# Patient Record
Sex: Female | Born: 1975 | Race: White | Hispanic: No | Marital: Single | Smoking: Current every day smoker
Health system: Southern US, Community
[De-identification: ages and names within clinical notes are randomized; demographics above are authoritative.]

## PROBLEM LIST (undated history)

## (undated) DIAGNOSIS — F419 Anxiety disorder, unspecified: Secondary | ICD-10-CM

## (undated) DIAGNOSIS — K219 Gastro-esophageal reflux disease without esophagitis: Secondary | ICD-10-CM

## (undated) DIAGNOSIS — B1920 Unspecified viral hepatitis C without hepatic coma: Secondary | ICD-10-CM

## (undated) DIAGNOSIS — F32A Depression, unspecified: Secondary | ICD-10-CM

## (undated) DIAGNOSIS — I10 Essential (primary) hypertension: Secondary | ICD-10-CM

## (undated) DIAGNOSIS — F1910 Other psychoactive substance abuse, uncomplicated: Secondary | ICD-10-CM

## (undated) HISTORY — DX: Depression, unspecified: F32.A

## (undated) HISTORY — PX: DILATION AND CURETTAGE OF UTERUS: SHX78

## (undated) HISTORY — DX: Other psychoactive substance abuse, uncomplicated: F19.10

## (undated) HISTORY — DX: Essential (primary) hypertension: I10

## (undated) HISTORY — DX: Anxiety disorder, unspecified: F41.9

---

## 2001-03-11 ENCOUNTER — Other Ambulatory Visit: Admission: RE | Admit: 2001-03-11 | Discharge: 2001-03-11 | Payer: Self-pay | Admitting: *Deleted

## 2005-08-15 ENCOUNTER — Inpatient Hospital Stay (HOSPITAL_COMMUNITY): Admission: AD | Admit: 2005-08-15 | Discharge: 2005-08-17 | Payer: Self-pay | Admitting: Family Medicine

## 2006-02-24 ENCOUNTER — Ambulatory Visit (HOSPITAL_COMMUNITY): Admission: RE | Admit: 2006-02-24 | Discharge: 2006-02-24 | Payer: Self-pay | Admitting: Family Medicine

## 2007-04-28 ENCOUNTER — Emergency Department (HOSPITAL_COMMUNITY): Admission: EM | Admit: 2007-04-28 | Discharge: 2007-04-29 | Payer: Self-pay | Admitting: Emergency Medicine

## 2007-12-03 ENCOUNTER — Emergency Department (HOSPITAL_COMMUNITY): Admission: EM | Admit: 2007-12-03 | Discharge: 2007-12-04 | Payer: Self-pay | Admitting: Emergency Medicine

## 2007-12-10 ENCOUNTER — Emergency Department (HOSPITAL_COMMUNITY): Admission: EM | Admit: 2007-12-10 | Discharge: 2007-12-10 | Payer: Self-pay | Admitting: Emergency Medicine

## 2007-12-17 ENCOUNTER — Emergency Department (HOSPITAL_COMMUNITY): Admission: EM | Admit: 2007-12-17 | Discharge: 2007-12-17 | Payer: Self-pay | Admitting: Emergency Medicine

## 2007-12-18 ENCOUNTER — Emergency Department (HOSPITAL_COMMUNITY): Admission: EM | Admit: 2007-12-18 | Discharge: 2007-12-18 | Payer: Self-pay | Admitting: Emergency Medicine

## 2008-01-22 ENCOUNTER — Emergency Department (HOSPITAL_COMMUNITY): Admission: EM | Admit: 2008-01-22 | Discharge: 2008-01-22 | Payer: Self-pay | Admitting: Emergency Medicine

## 2008-04-07 ENCOUNTER — Emergency Department (HOSPITAL_COMMUNITY): Admission: EM | Admit: 2008-04-07 | Discharge: 2008-04-08 | Payer: Self-pay | Admitting: Emergency Medicine

## 2008-04-08 ENCOUNTER — Inpatient Hospital Stay (HOSPITAL_COMMUNITY): Admission: EM | Admit: 2008-04-08 | Discharge: 2008-04-11 | Payer: Self-pay | Admitting: Emergency Medicine

## 2008-06-14 ENCOUNTER — Encounter (INDEPENDENT_AMBULATORY_CARE_PROVIDER_SITE_OTHER): Payer: Self-pay | Admitting: Unknown Physician Specialty

## 2008-06-14 ENCOUNTER — Other Ambulatory Visit: Admission: RE | Admit: 2008-06-14 | Discharge: 2008-06-14 | Payer: Self-pay | Admitting: Unknown Physician Specialty

## 2008-11-04 ENCOUNTER — Emergency Department (HOSPITAL_COMMUNITY): Admission: EM | Admit: 2008-11-04 | Discharge: 2008-11-05 | Payer: Self-pay | Admitting: Emergency Medicine

## 2009-11-21 ENCOUNTER — Emergency Department (HOSPITAL_COMMUNITY): Admission: EM | Admit: 2009-11-21 | Discharge: 2009-11-21 | Payer: Self-pay | Admitting: Emergency Medicine

## 2009-11-28 ENCOUNTER — Other Ambulatory Visit: Admission: RE | Admit: 2009-11-28 | Discharge: 2009-11-28 | Payer: Self-pay | Admitting: Unknown Physician Specialty

## 2010-01-09 ENCOUNTER — Emergency Department (HOSPITAL_COMMUNITY): Admission: EM | Admit: 2010-01-09 | Discharge: 2010-01-09 | Payer: Self-pay | Admitting: Emergency Medicine

## 2010-05-31 ENCOUNTER — Emergency Department (HOSPITAL_COMMUNITY): Admission: EM | Admit: 2010-05-31 | Discharge: 2010-05-31 | Payer: Self-pay | Admitting: Emergency Medicine

## 2010-06-05 ENCOUNTER — Emergency Department (HOSPITAL_COMMUNITY): Admission: EM | Admit: 2010-06-05 | Discharge: 2010-06-05 | Payer: Self-pay | Admitting: Emergency Medicine

## 2010-07-23 ENCOUNTER — Emergency Department (HOSPITAL_COMMUNITY): Admission: EM | Admit: 2010-07-23 | Discharge: 2010-07-23 | Payer: Self-pay | Admitting: Emergency Medicine

## 2010-08-20 ENCOUNTER — Inpatient Hospital Stay (HOSPITAL_COMMUNITY): Admission: EM | Admit: 2010-08-20 | Discharge: 2010-08-24 | Payer: Self-pay | Source: Home / Self Care

## 2010-09-22 ENCOUNTER — Emergency Department (HOSPITAL_COMMUNITY)
Admission: EM | Admit: 2010-09-22 | Discharge: 2010-09-22 | Payer: Self-pay | Source: Home / Self Care | Admitting: Emergency Medicine

## 2010-10-01 LAB — BASIC METABOLIC PANEL
BUN: 10 mg/dL (ref 6–23)
CO2: 26 mEq/L (ref 19–32)
Calcium: 9.2 mg/dL (ref 8.4–10.5)
Chloride: 102 mEq/L (ref 96–112)
Creatinine, Ser: 0.94 mg/dL (ref 0.4–1.2)
GFR calc Af Amer: >60 mL/min (ref >60)
GFR calc non Af Amer: >60 mL/min (ref >60)
Glucose, Bld: 110 mg/dL — ABNORMAL HIGH (ref 70–99)
Potassium: 3.4 mEq/L — ABNORMAL LOW (ref 3.5–5.1)
Sodium: 136 mEq/L (ref 135–145)

## 2010-10-01 LAB — CBC
HCT: 43.2 % (ref 36.0–46.0)
Hemoglobin: 15.2 g/dL — ABNORMAL HIGH (ref 12.0–15.0)
MCH: 30 pg (ref 26.0–34.0)
MCHC: 35.2 g/dL (ref 30.0–36.0)
MCV: 85.4 fL (ref 78.0–100.0)
Platelets: 193 10*3/uL (ref 150–400)
RBC: 5.06 MIL/uL (ref 3.87–5.11)
RDW: 12.9 % (ref 11.5–15.5)
WBC: 9.2 10*3/uL (ref 4.0–10.5)

## 2010-10-01 LAB — RAPID URINE DRUG SCREEN, HOSP PERFORMED
Amphetamines: NOT DETECTED
Barbiturates: NOT DETECTED
Benzodiazepines: NOT DETECTED
Cocaine: NOT DETECTED
Opiates: POSITIVE — AB
Tetrahydrocannabinol: NOT DETECTED

## 2010-10-01 LAB — ETHANOL: Alcohol, Ethyl (B): <5 mg/dL (ref 0–10)

## 2010-10-01 LAB — DIFFERENTIAL
Basophils Absolute: 0.1 10*3/uL (ref 0.0–0.1)
Basophils Relative: 1 % (ref 0–1)
Eosinophils Absolute: 0.5 10*3/uL (ref 0.0–0.7)
Eosinophils Relative: 5 % (ref 0–5)
Lymphocytes Relative: 36 % (ref 12–46)
Lymphs Abs: 3.4 10*3/uL (ref 0.7–4.0)
Monocytes Absolute: 0.6 10*3/uL (ref 0.1–1.0)
Monocytes Relative: 6 % (ref 3–12)
Neutro Abs: 4.8 10*3/uL (ref 1.7–7.7)
Neutrophils Relative %: 52 % (ref 43–77)

## 2010-11-26 LAB — BASIC METABOLIC PANEL
BUN: 7 mg/dL (ref 6–23)
CO2: 17 mEq/L — ABNORMAL LOW (ref 19–32)
Calcium: 8.1 mg/dL — ABNORMAL LOW (ref 8.4–10.5)
GFR calc Af Amer: >60 mL/min (ref >60)
GFR calc Af Amer: >60 mL/min (ref >60)
GFR calc non Af Amer: >60 mL/min (ref >60)
GFR calc non Af Amer: >60 mL/min (ref >60)
Potassium: 4.1 mEq/L (ref 3.5–5.1)
Sodium: 141 mEq/L (ref 135–145)

## 2010-11-26 LAB — MAGNESIUM
Magnesium: 1.2 mg/dL — ABNORMAL LOW (ref 1.5–2.5)
Magnesium: 1.4 mg/dL — ABNORMAL LOW (ref 1.5–2.5)

## 2010-11-26 LAB — URINALYSIS, ROUTINE W REFLEX MICROSCOPIC
Bilirubin Urine: NEGATIVE
Glucose, UA: NEGATIVE mg/dL
Glucose, UA: NEGATIVE mg/dL
Ketones, ur: NEGATIVE mg/dL
Leukocytes, UA: NEGATIVE
Leukocytes, UA: NEGATIVE
Nitrite: NEGATIVE
Protein, ur: >300 mg/dL — AB
Protein, ur: NEGATIVE mg/dL
Specific Gravity, Urine: 1.025 (ref 1.005–1.030)
Urobilinogen, UA: 0.2 mg/dL (ref 0.0–1.0)
pH: 5.5 (ref 5.0–8.0)
pH: 6 (ref 5.0–8.0)

## 2010-11-26 LAB — CULTURE, BLOOD (ROUTINE X 2): Culture: NO GROWTH

## 2010-11-26 LAB — URINE MICROSCOPIC-ADD ON

## 2010-11-26 LAB — DIFFERENTIAL
Basophils Relative: 0 % (ref 0–1)
Eosinophils Absolute: 0.4 10*3/uL (ref 0.0–0.7)
Eosinophils Relative: 1 % (ref 0–5)
Eosinophils Relative: 1 % (ref 0–5)
Eosinophils Relative: 5 % (ref 0–5)
Lymphocytes Relative: 12 % (ref 12–46)
Lymphocytes Relative: 12 % (ref 12–46)
Lymphocytes Relative: 30 % (ref 12–46)
Lymphs Abs: 0.4 10*3/uL — ABNORMAL LOW (ref 0.7–4.0)
Lymphs Abs: 2.2 10*3/uL (ref 0.7–4.0)
Lymphs Abs: 2.7 10*3/uL (ref 0.7–4.0)
Lymphs Abs: 3.1 10*3/uL (ref 0.7–4.0)
Monocytes Absolute: 0 10*3/uL — ABNORMAL LOW (ref 0.1–1.0)
Monocytes Absolute: 1.3 10*3/uL — ABNORMAL HIGH (ref 0.1–1.0)
Monocytes Relative: 0 % — ABNORMAL LOW (ref 3–12)
Monocytes Relative: 4 % (ref 3–12)
Monocytes Relative: 5 % (ref 3–12)
Neutro Abs: 15.5 10*3/uL — ABNORMAL HIGH (ref 1.7–7.7)
Neutrophils Relative %: 83 % — ABNORMAL HIGH (ref 43–77)

## 2010-11-26 LAB — MRSA PCR SCREENING: MRSA by PCR: NEGATIVE

## 2010-11-26 LAB — CBC
HCT: 33.6 % — ABNORMAL LOW (ref 36.0–46.0)
Hemoglobin: 10.8 g/dL — ABNORMAL LOW (ref 12.0–15.0)
Hemoglobin: 11.2 g/dL — ABNORMAL LOW (ref 12.0–15.0)
Hemoglobin: 11.6 g/dL — ABNORMAL LOW (ref 12.0–15.0)
MCH: 30.1 pg (ref 26.0–34.0)
MCH: 30.8 pg (ref 26.0–34.0)
MCH: 30.8 pg (ref 26.0–34.0)
MCHC: 34.1 g/dL (ref 30.0–36.0)
MCHC: 34.9 g/dL (ref 30.0–36.0)
MCV: 87.3 fL (ref 78.0–100.0)
Platelets: 138 10*3/uL — ABNORMAL LOW (ref 150–400)
RBC: 3.64 MIL/uL — ABNORMAL LOW (ref 3.87–5.11)
RBC: 3.85 MIL/uL — ABNORMAL LOW (ref 3.87–5.11)
RDW: 13.3 % (ref 11.5–15.5)
WBC: 18.7 10*3/uL — ABNORMAL HIGH (ref 4.0–10.5)
WBC: 9.1 10*3/uL (ref 4.0–10.5)

## 2010-11-26 LAB — COMPREHENSIVE METABOLIC PANEL
ALT: 16 U/L (ref 0–35)
ALT: 21 U/L (ref 0–35)
AST: 23 U/L (ref 0–37)
AST: 27 U/L (ref 0–37)
Albumin: 2.7 g/dL — ABNORMAL LOW (ref 3.5–5.2)
Albumin: 3.4 g/dL — ABNORMAL LOW (ref 3.5–5.2)
Calcium: 7.9 mg/dL — ABNORMAL LOW (ref 8.4–10.5)
Calcium: 8.3 mg/dL — ABNORMAL LOW (ref 8.4–10.5)
Creatinine, Ser: 1.37 mg/dL — ABNORMAL HIGH (ref 0.4–1.2)
GFR calc Af Amer: 54 mL/min — ABNORMAL LOW (ref >60)
GFR calc Af Amer: >60 mL/min (ref >60)
Sodium: 140 mEq/L (ref 135–145)
Sodium: 144 mEq/L (ref 135–145)
Total Protein: 5.6 g/dL — ABNORMAL LOW (ref 6.0–8.3)
Total Protein: 6.1 g/dL (ref 6.0–8.3)

## 2010-11-26 LAB — URINE CULTURE
Colony Count: NO GROWTH
Culture  Setup Time: 201112071345
Culture: NO GROWTH

## 2010-11-26 LAB — STREP PNEUMONIAE URINARY ANTIGEN: Strep Pneumo Urinary Antigen: NEGATIVE

## 2010-11-26 LAB — CORTISOL-AM, BLOOD: Cortisol - AM: 32.1 ug/dL — ABNORMAL HIGH (ref 4.3–22.4)

## 2010-11-26 LAB — LACTATE DEHYDROGENASE: LDH: 173 U/L (ref 94–250)

## 2010-11-26 LAB — LEGIONELLA ANTIGEN, URINE

## 2010-11-26 LAB — LIPASE, BLOOD: Lipase: 35 U/L (ref 11–59)

## 2010-12-09 LAB — URINE MICROSCOPIC-ADD ON

## 2010-12-09 LAB — URINE CULTURE

## 2010-12-09 LAB — URINALYSIS, ROUTINE W REFLEX MICROSCOPIC
Bilirubin Urine: NEGATIVE
Glucose, UA: NEGATIVE mg/dL
Ketones, ur: NEGATIVE mg/dL
Nitrite: POSITIVE — AB
Specific Gravity, Urine: 1.025 (ref 1.005–1.030)
pH: 5.5 (ref 5.0–8.0)

## 2011-01-29 NOTE — H&P (Signed)
Sally Bennett, MIJANGOS               ACCOUNT NO.:  [id number]   MEDICAL RECORD NO.:  [id number]          PATIENT TYPE:  INP   LOCATION:  A303                          FACILITY:  [facility]   PHYSICIAN:  Scott A. Gerda Diss, MD    DATE OF BIRTH:  Jan 05, 1976   DATE OF ADMISSION:  04/08/2008  DATE OF DISCHARGE:  LH                              HISTORY & PHYSICAL   CHIEF COMPLAINT:  Right flank pain and discomfort.   HISTORY OF PRESENT ILLNESS:  This is a 35 year old white female who  started noticing some right flank pain and discomfort over the past  several days, became progressively worse despite trying some over-the-  counter medications, then she started noticing some mild fever, chills,  nausea, and vomiting.  She came to the emergency department, evaluated,  was told she had pyelonephritis, and was encouraged to be admitted but  at that point in time, she preferred to try outpatient therapy.  She  went home on Cipro and promptly was unable to keep that down and was  throwing up Cipro and comes back to the emergency department today  because of increasing pain and right flank discomfort along with nausea.  She denies dysuria or urinary frequency.  Denies diarrhea.  Denies  cough, congestion, or headache.   PAST MEDICAL HISTORY:  The patient has had some problems in the past  with depression and anxiety, also migraine and chronic back pain.   FAMILY HISTORY:  Noncontributory.  No HTN, diabetes, or heart disease.   ALLERGIES:  None.   Smokes a half to three-fourths of a pack a day.  Does not drink.   She is not on any medications currently.   REVIEW OF SYSTEMS:  See per above.   PHYSICAL EXAMINATION:  GENERAL:  Looks to feel ill.  VITAL SIGNS:  Low-grade fever.  HEENT:  TMs NL __________.  NECK:  No masses.  CHEST:  CTA, no crackles.  HEART:  Regular.  ABDOMEN:  Soft with right flank discomfort to percussion and mild right  upper quadrant discomfort on the axillary line.   Urinalysis showed a large number of wbc's and bacteria as well.  Urine  culture was gathered last night before she was sent home.  White count  18,000, left shift.  Current CBC and MET-7 are pending.   ASSESSMENT/PLAN:  Pyelonephritis, failed outpatient management.  Recommend IV antibiotics and IV fluids and pain and nausea control.  Medication for anxiety and sleep and Tylenol for any fever and monitor  closely and hopefully potentially home over the course of the next 48-72  hours depending on how quickly she responds.  Monitor closely.      Scott A. Gerda Diss, MD  Electronically Signed     SAL/MEDQ  D:  04/08/2008  T:  04/09/2008  Job:  [id number]

## 2011-01-29 NOTE — Group Therapy Note (Signed)
Sally Bennett, Sally Bennett               ACCOUNT NO.:  [id number]   MEDICAL RECORD NO.:  [id number]          PATIENT TYPE:  INP   LOCATION:  A303                          FACILITY:  [facility]   PHYSICIAN:  Scott A. Gerda Diss, MD    DATE OF BIRTH:  1976/04/05   DATE OF PROCEDURE:  04/09/2008  DATE OF DISCHARGE:                                 PROGRESS NOTE   Overall feeling a little bit better, but still has pretty severe pain in  the right flank area and states that it woke her up multiple times  during the night.  She just had a hard time resting.  It should be noted  that she did have her blood work done, which did in fact show CBC of  16,000 white count and left shift and urine microscope, which did in  fact show 2150 WBCs and also GC Chlamydia came back from April 07, 2008,  which was negative.  It also should be noted that the patient did not  have a wet prep, which was none seen.  Looking at her records, it has  been my understanding that a urine pregnancy was done, but I do not see  where or when it was done, so urine pregnancy test was ordered plus also  two CBC MET-7 in the morning on April 10, 2008, plus also increase the  Dilaudid range 1 mg to 2 mg q.3 h. p.r.n. pain and Ambien 10 mg nightly  p.r.n. and wheelchair use if off floor and change IV fluids to D5 0.45  with 20 of KCl per liter at 100 mL an hour, and I do not feel the  patient is stable and to go home.  At this point, I recommend that she  stay in and it should be noted that her lungs are clear.  Heart regular.  Abdomen is soft, right flank tenderness, and she did appear to be  uncomfortable.  Please see orders and continue on with the orders as  written.      Scott A. Gerda Diss, MD  Electronically Signed     SAL/MEDQ  D:  04/09/2008  T:  04/09/2008  Job:  [id number]

## 2011-02-01 NOTE — H&P (Signed)
NAMECHEYENNA, Sally Bennett               ACCOUNT NO.:  [id number]   MEDICAL RECORD NO.:  [id number]          PATIENT TYPE:  INP   LOCATION:  A307                          FACILITY:  [facility]   PHYSICIAN:  Donna Bernard, M.D.DATE OF BIRTH:  [birth year]   DATE OF ADMISSION:  08/15/2005  DATE OF DISCHARGE:  LH                                HISTORY & PHYSICAL   CHIEF COMPLAINT:  Abdominal pain, vomiting and diarrhea.   HISTORY OF PRESENT ILLNESS:  This patient is a 35 year old, white female who  presented to the office the day of admission with complaints of severe  distress.  She states she had mid epigastric abdominal pain very severe in  nature, sharp, deep, aching at times, minimal radiation.  This was  accompanied since last evening by profuse diarrhea along with vomiting.  The  patient also noted feeling very anxious and tremulous.  On further history,  the patient states that she has been taking 10 Vicodin per day on average  for quite some time.  Of note, we were prescribing her Vicodin on the order  of 20 maximum per month which would equal of course less than one tablet per  day.  The patient obtained all these other Vicodin from other sources.  The patient recently had been going through a lot of marital stress and  temporary separation with her husband.  This is now improved.  She had a  frank talk with her husband last night with the strong desire to want to get  off of medicine.  The patient is not suicidal and not homicidal.  She is  depressed.  She is anxious.  She has never had history before with  difficulties with medicine addiction in the past.   PAST MEDICAL HISTORY:  1.  Migraine headaches.  2.  Chronic back pain.  3.  Depression and anxiety.   FAMILY HISTORY:  Strong family history of depression.   SOCIAL HISTORY:  The patient is married.  Smokes 1/2 pack per day.  No  alcohol use.  The patient has one child.   ALLERGIES:  No known drug allergies.   REVIEW OF  SYSTEMS:  Otherwise negative.   PHYSICAL EXAMINATION:  VITAL SIGNS:  Blood pressure 105/64, weight 107,  afebrile.  GENERAL:  The patient is slightly tearful, shaking, holding her abdomen in  significant distress when first seen in the office.  HEENT:  TMs normal.  Fundi and discs sharp.  Pharynx normal.  NECK:  Supple.  LUNGS:  Clear.  No tachypnea.  HEART:  Regular rate and rhythm.  ABDOMEN:  Soft.  No spinal tenderness.  Diffuse abdominal tenderness in the  abdomen across the upper abdomen primarily in the lower abdomen.  NEUROLOGIC:  Reflexes good.  SKIN:  Normal.   LABORATORY DATA AND X-RAY FINDINGS:  Significant labs pending.   IMPRESSION:  Abdominal pain, vomiting, diarrhea, tremulousness all in the  environment from sudden withdrawal from high-intake narcotics.  This may  well be the etiology to all her symptoms.   PLAN:  1.  IV fluids.  2.  Blood work.  3.  Further orders as noted in the chart.  4.  Will initiate Catapres patch.  5.  Utilize Xanax for anxiety or insomnia.  6.  Will give her ever decreasing morphine doses IV p.r.n. 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  7.  Further orders as noted in the chart.      Donna Bernard, M.D.  Electronically Signed     WSL/MEDQ  D:  08/15/2005  T:  08/15/2005  Job:  [id number]

## 2011-02-01 NOTE — Discharge Summary (Signed)
NAMESHABREA, WELDIN               ACCOUNT NO.:  [id number]   MEDICAL RECORD NO.:  [id number]          PATIENT TYPE:  INP   LOCATION:  A303                          FACILITY:  [facility]   PHYSICIAN:  Scott A. Gerda Diss, MD    DATE OF BIRTH:  [birth year]   DATE OF ADMISSION:  04/08/2008  DATE OF DISCHARGE:  07/27/2009LH                               DISCHARGE SUMMARY   DISCHARGE DIAGNOSIS:  Pyelonephritis.   Admitted in after having right flank pain for several days, nausea,  vomiting, and chills.  Came to the emergency department, evaluated, and  felt to have pyelonephritis.  She has had problems in the past of  depression, anxiety, migraine, and back pain.  She was treated with IV  antibiotics.  It was noted that she was seen in the ED a couple of days  previous, treated with Cipro, unable to keep it down, and came back  because of increased discomfort.  She had 18,000 white count with a left  shift.  She was treated with antibiotics, and should also be noted that  we did a urine pregnancy test is negative as well.  She gradually  improved over the course of the next couple of days and was finally felt  stable for discharge on April 11, 2008, so she was discharged to home on  oral antibiotics and instructed to follow up with Dr. Simone Curia  next week.      Scott A. Gerda Diss, MD  Electronically Signed     SAL/MEDQ  D:  05/13/2008  T:  05/13/2008  Job:  [id number]

## 2011-02-01 NOTE — Discharge Summary (Signed)
Sally Bennett, Sally Bennett               ACCOUNT NO.:  [id number]   MEDICAL RECORD NO.:  [id number]          PATIENT TYPE:  INP   LOCATION:  A307                          FACILITY:  [facility]   PHYSICIAN:  Donna Bernard, M.D.DATE OF BIRTH:  September 15, 1976   DATE OF ADMISSION:  08/15/2005  DATE OF DISCHARGE:  12/02/2006LH                                 DISCHARGE SUMMARY   FINAL DIAGNOSES:  1.  Abdominal pain.  2.  Narcotic withdrawal.  3.  Depression/anxiety.   FINAL DISPOSITION:  Patient discharged to home.   DISCHARGE MEDICATIONS:  1.  Lexapro 10 mg daily.  2.  Xanax 0.5 mg p.r.n. sparingly for anxiety.  3.  Prilosec over-the-counter one daily.  4.  Ultram 50 mg one up to four times a day as needed for pain to be used      only short-term.   Follow up in the office next week with Dr. Lubertha South.   HISTORY AND PHYSICAL:  Please see H&P as dictated.   HOSPITAL COURSE:  This patient is a 35 year old white female who presented  to the office day of admission with complaints of significant abdominal  pain, aching at times.  This was accompanied by significant diarrhea and  vomiting.  The patient was also feeling very anxious.  She notes she had  been taking approximately 10 Vicodin per day on average for quite some time.  The vast majority she had obtained in an illicit manner.  She states she  took these to help face the stress of a separation from her husband.  Recently she and her husband got back together.  She had tried to abruptly  stop the medicine and developed all the symptoms that she developed.  The  patient was in severe discomfort.  She was strongly disinclined to go to any  other kind of inpatient setting for detoxification.  We went ahead and  admitted her to the hospital.  Patient was given IV fluids.  She was given  morphine initially for the pain.  Protonix 40 mg IV was administered every  24 hours.  A Catapres patch was applied for central blockade to help  diminish  symptoms from withdrawal.  Patient was found to be hypokalemic.  Her potassium therefore was supplemented with 20 mEq two daily.  Patient was  also given nicotine patch due to significant nicotine withdrawal symptoms.  Patient's initial white blood count was 16,600.  On repeat the following day  it was down to 11,000.  Her initial potassium was 3.1, on repeat 3.6.  Liver  enzymes were normal.  Patient improved rather quickly.  Day of  discharge her symptoms had much improved.  She was feeling ready to go home.  She was noting no thoughts of suicide or homicide or wanting to harm anybody  or herself.  Patient was discharged home with diagnosis and disposition as  noted above.      Donna Bernard, M.D.  Electronically Signed     WSL/MEDQ  D:  09/15/2005  T:  09/16/2005  Job:  [id number]

## 2011-02-25 IMAGING — CR DG CHEST 2V
2 series · 2 of 2 positions shown · non-contrast
Comparison: 08/21/2010

CLINICAL DATA: Septic shock, bilateral pneumonia

CHEST - 2 VIEW

[view not recorded (1 of 2)]
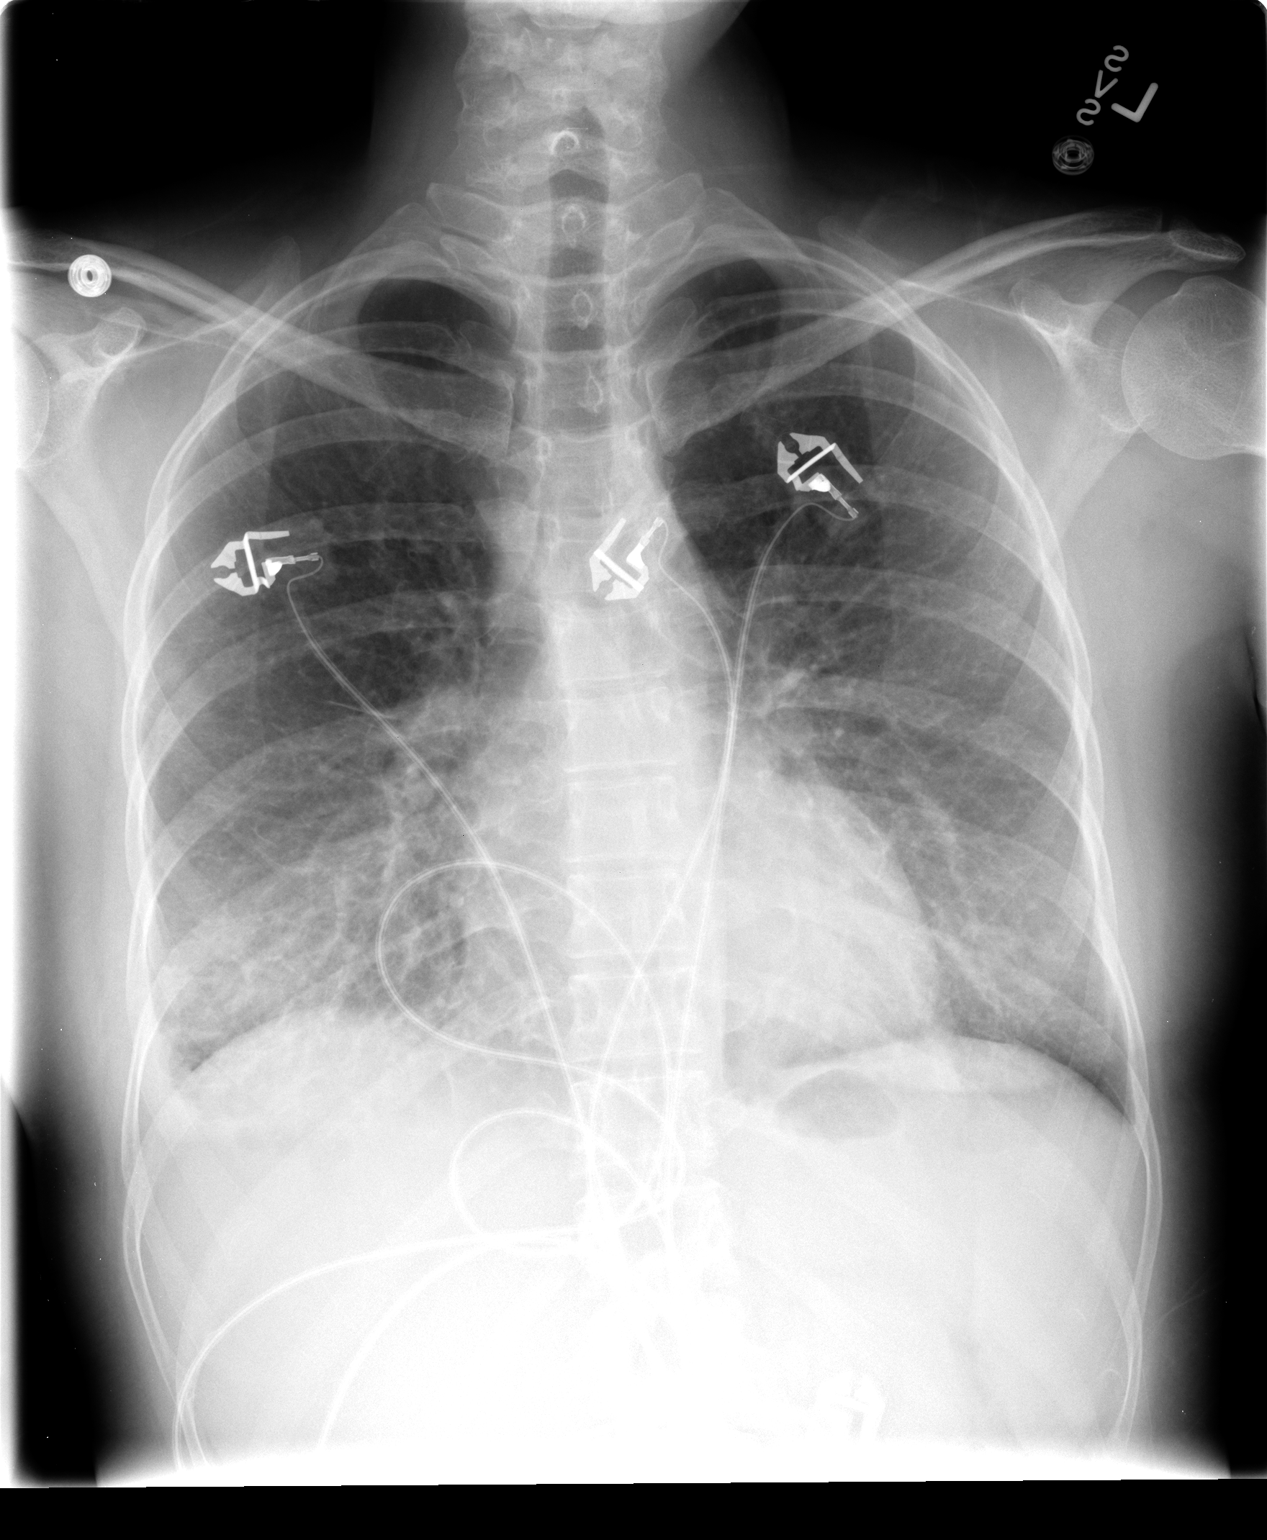

[view not recorded (2 of 2)]
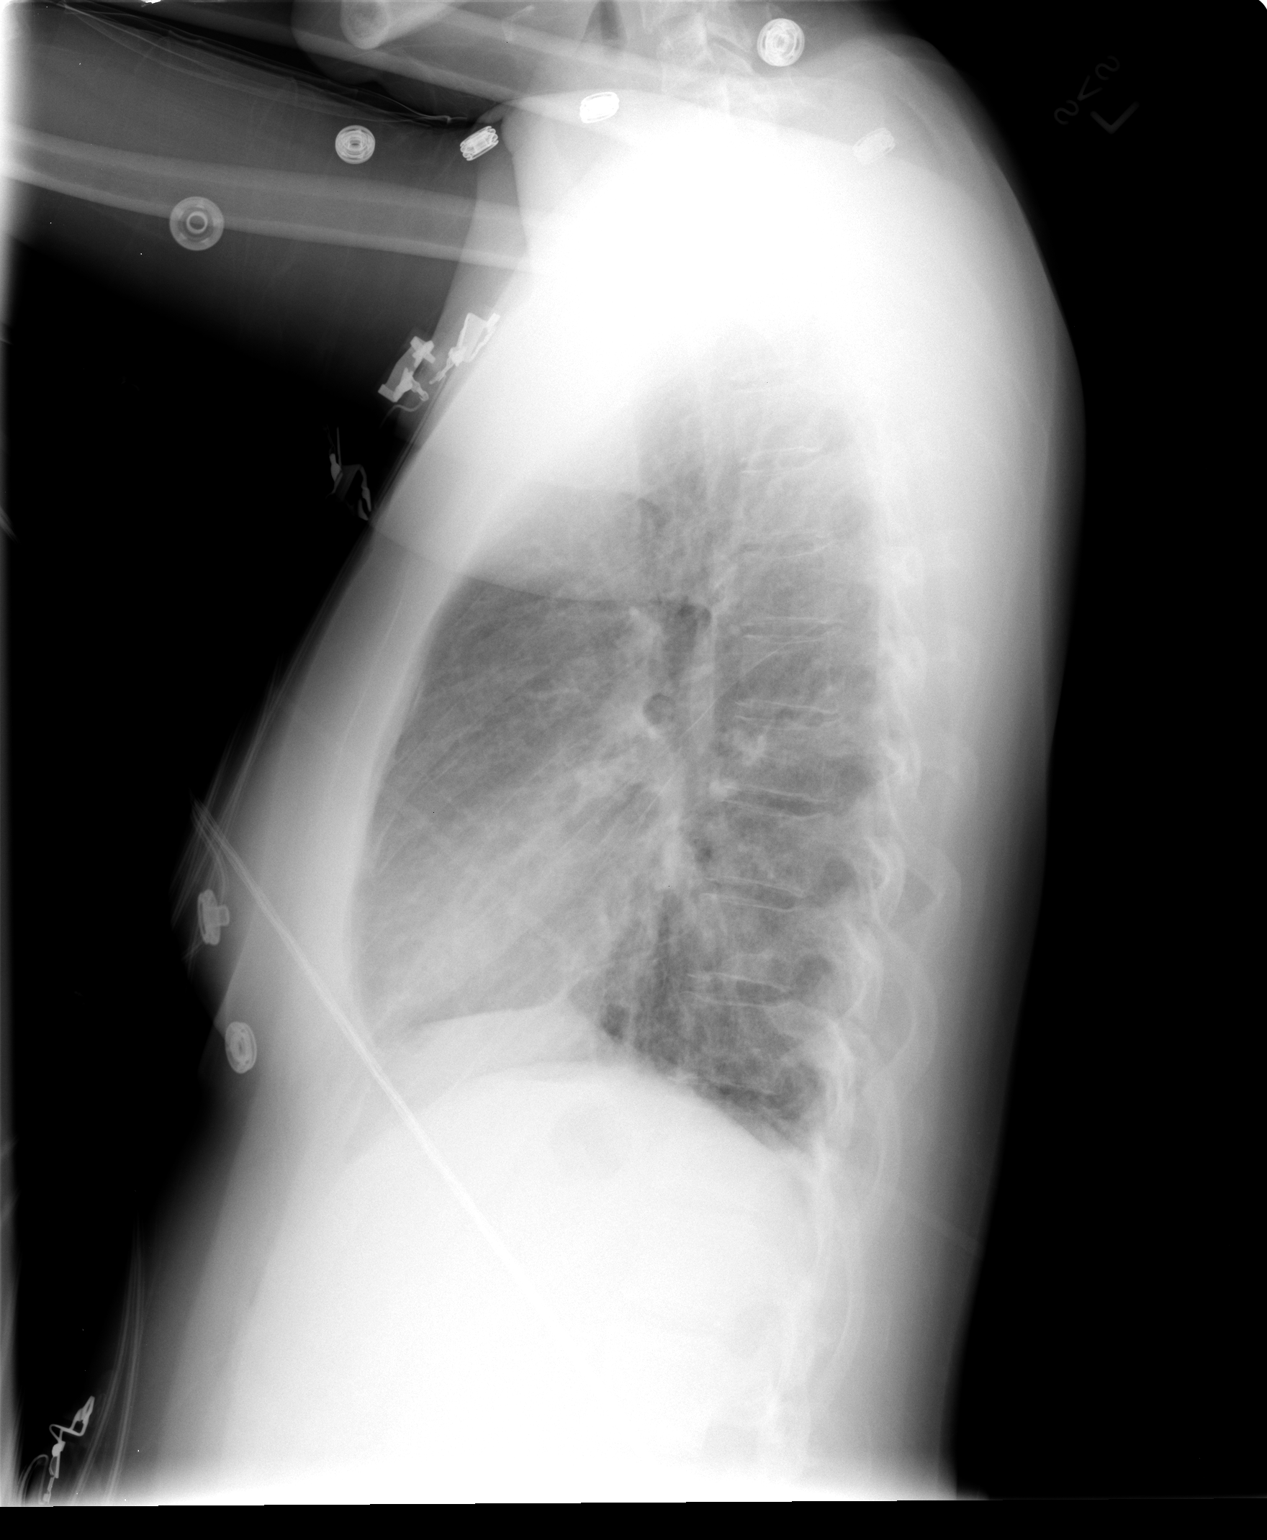

[2 of 2 positions shown; findings below may reference images not displayed]

FINDINGS: Upper normal-size of cardiac silhouette.
Mediastinal contours and pulmonary vascularity normal.
Left basilar infiltrate improved with persistent right basilar
infiltrate.
Small right pleural effusion.
Upper lungs clear.
Mild peribronchial thickening centrally.
No pneumothorax or acute osseous finding.
IMPRESSION: Persistent right basilar infiltrate with improved aeration at left
lung base since previous study.
New small right pleural effusion.

## 2011-06-11 LAB — PREGNANCY, URINE: Preg Test, Ur: NEGATIVE

## 2011-06-14 LAB — COMPREHENSIVE METABOLIC PANEL
ALT: 11
Alkaline Phosphatase: 108
BUN: 6
CO2: 24
Chloride: 106
GFR calc non Af Amer: >60
Glucose, Bld: 98
Potassium: 3.4 — ABNORMAL LOW
Sodium: 136
Total Bilirubin: 0.7
Total Protein: 6.9

## 2011-06-14 LAB — DIFFERENTIAL
Basophils Relative: 0
Basophils Relative: 0
Basophils Relative: 1
Eosinophils Absolute: 0.5
Eosinophils Absolute: 0.9 — ABNORMAL HIGH
Eosinophils Relative: 2
Eosinophils Relative: 3
Lymphocytes Relative: 9 — ABNORMAL LOW
Lymphs Abs: 1.7
Lymphs Abs: 2.1
Monocytes Absolute: 1.2 — ABNORMAL HIGH
Monocytes Relative: 6
Neutro Abs: 15.1 — ABNORMAL HIGH
Neutro Abs: 6.1
Neutrophils Relative %: 60

## 2011-06-14 LAB — PREGNANCY, URINE
Preg Test, Ur: NEGATIVE
Preg Test, Ur: NEGATIVE

## 2011-06-14 LAB — URINALYSIS, ROUTINE W REFLEX MICROSCOPIC
Bilirubin Urine: NEGATIVE
Ketones, ur: NEGATIVE
Nitrite: POSITIVE — AB
Protein, ur: 30 — AB
Protein, ur: 30 — AB
Specific Gravity, Urine: 1.02
Urobilinogen, UA: 0.2
Urobilinogen, UA: 4 — ABNORMAL HIGH
pH: 5.5

## 2011-06-14 LAB — CBC
HCT: 37.5
HCT: 39.1
Hemoglobin: 13.3
MCHC: 33.5
MCHC: 33.6
MCHC: 34
MCV: 96.2
MCV: 96.5
Platelets: 164
Platelets: 176
RBC: 4.09
RDW: 12.4
RDW: 12.7
WBC: 10.3
WBC: 16.2 — ABNORMAL HIGH

## 2011-06-14 LAB — GC/CHLAMYDIA PROBE AMP, GENITAL
Chlamydia, DNA Probe: NEGATIVE
GC Probe Amp, Genital: NEGATIVE

## 2011-06-14 LAB — WET PREP, GENITAL: Clue Cells Wet Prep HPF POC: NONE SEEN

## 2011-06-14 LAB — URINE CULTURE
Colony Count: NO GROWTH
Culture: NO GROWTH

## 2011-06-14 LAB — URINE MICROSCOPIC-ADD ON

## 2011-06-14 LAB — BASIC METABOLIC PANEL
BUN: 2 — ABNORMAL LOW
Calcium: 9
Chloride: 107
Creatinine, Ser: 0.69

## 2011-07-01 LAB — BASIC METABOLIC PANEL
Calcium: 8.8
GFR calc Af Amer: >60
GFR calc non Af Amer: >60
Sodium: 140

## 2011-07-01 LAB — DIFFERENTIAL
Basophils Absolute: 0.1
Lymphocytes Relative: 32
Lymphs Abs: 4.1 — ABNORMAL HIGH
Monocytes Absolute: 0.7
Monocytes Relative: 6
Neutro Abs: 7.3

## 2011-07-01 LAB — CBC
Hemoglobin: 14.8
RBC: 4.87

## 2015-03-07 ENCOUNTER — Emergency Department (HOSPITAL_COMMUNITY): Payer: BLUE CROSS/BLUE SHIELD

## 2015-03-07 ENCOUNTER — Emergency Department (HOSPITAL_COMMUNITY)
Admission: EM | Admit: 2015-03-07 | Discharge: 2015-03-07 | Disposition: A | Discharge status: Home / Self Care | Payer: BLUE CROSS/BLUE SHIELD | Attending: Emergency Medicine | Admitting: Emergency Medicine

## 2015-03-07 ENCOUNTER — Encounter (HOSPITAL_COMMUNITY): Payer: Self-pay | Admitting: *Deleted

## 2015-03-07 DIAGNOSIS — R6883 Chills (without fever): Secondary | ICD-10-CM | POA: Insufficient documentation

## 2015-03-07 DIAGNOSIS — R112 Nausea with vomiting, unspecified: Secondary | ICD-10-CM | POA: Insufficient documentation

## 2015-03-07 DIAGNOSIS — Z720 Tobacco use: Secondary | ICD-10-CM | POA: Insufficient documentation

## 2015-03-07 DIAGNOSIS — Z3202 Encounter for pregnancy test, result negative: Secondary | ICD-10-CM | POA: Diagnosis not present

## 2015-03-07 DIAGNOSIS — R52 Pain, unspecified: Secondary | ICD-10-CM

## 2015-03-07 DIAGNOSIS — Z8619 Personal history of other infectious and parasitic diseases: Secondary | ICD-10-CM | POA: Diagnosis not present

## 2015-03-07 DIAGNOSIS — R109 Unspecified abdominal pain: Secondary | ICD-10-CM

## 2015-03-07 DIAGNOSIS — R1013 Epigastric pain: Secondary | ICD-10-CM | POA: Diagnosis not present

## 2015-03-07 HISTORY — DX: Unspecified viral hepatitis C without hepatic coma: B19.20

## 2015-03-07 LAB — URINALYSIS, ROUTINE W REFLEX MICROSCOPIC
BILIRUBIN URINE: NEGATIVE
Glucose, UA: NEGATIVE mg/dL
Hgb urine dipstick: NEGATIVE
Ketones, ur: NEGATIVE mg/dL
LEUKOCYTES UA: NEGATIVE
NITRITE: NEGATIVE
PH: 5.5 (ref 5.0–8.0)
Protein, ur: NEGATIVE mg/dL
SPECIFIC GRAVITY, URINE: 1.01 (ref 1.005–1.030)
Urobilinogen, UA: 0.2 mg/dL (ref 0.0–1.0)

## 2015-03-07 LAB — COMPREHENSIVE METABOLIC PANEL
ALK PHOS: 115 U/L (ref 38–126)
ALT: 47 U/L (ref 14–54)
AST: 43 U/L — ABNORMAL HIGH (ref 15–41)
Albumin: 3.9 g/dL (ref 3.5–5.0)
Anion gap: 8 (ref 5–15)
BILIRUBIN TOTAL: 0.5 mg/dL (ref 0.3–1.2)
BUN: 9 mg/dL (ref 6–20)
CO2: 26 mmol/L (ref 22–32)
Calcium: 8.9 mg/dL (ref 8.9–10.3)
Chloride: 103 mmol/L (ref 101–111)
Creatinine, Ser: 0.73 mg/dL (ref 0.44–1.00)
GLUCOSE: 94 mg/dL (ref 65–99)
POTASSIUM: 3.2 mmol/L — AB (ref 3.5–5.1)
SODIUM: 137 mmol/L (ref 135–145)
TOTAL PROTEIN: 8.1 g/dL (ref 6.5–8.1)

## 2015-03-07 LAB — LIPASE, BLOOD: LIPASE: 23 U/L (ref 22–51)

## 2015-03-07 LAB — CBC WITH DIFFERENTIAL/PLATELET
BASOS PCT: 0 % (ref 0–1)
Basophils Absolute: 0 10*3/uL (ref 0.0–0.1)
EOS ABS: 0.7 10*3/uL (ref 0.0–0.7)
EOS PCT: 4 % (ref 0–5)
HEMATOCRIT: 46.7 % — AB (ref 36.0–46.0)
HEMOGLOBIN: 16 g/dL — AB (ref 12.0–15.0)
Lymphocytes Relative: 18 % (ref 12–46)
Lymphs Abs: 2.9 10*3/uL (ref 0.7–4.0)
MCH: 29.4 pg (ref 26.0–34.0)
MCHC: 34.3 g/dL (ref 30.0–36.0)
MCV: 85.8 fL (ref 78.0–100.0)
MONO ABS: 1 10*3/uL (ref 0.1–1.0)
MONOS PCT: 6 % (ref 3–12)
Neutro Abs: 11.3 10*3/uL — ABNORMAL HIGH (ref 1.7–7.7)
Neutrophils Relative %: 72 % (ref 43–77)
Platelets: 170 10*3/uL (ref 150–400)
RBC: 5.44 MIL/uL — ABNORMAL HIGH (ref 3.87–5.11)
RDW: 13.7 % (ref 11.5–15.5)
WBC: 15.9 10*3/uL — ABNORMAL HIGH (ref 4.0–10.5)

## 2015-03-07 LAB — POC URINE PREG, ED: Preg Test, Ur: NEGATIVE

## 2015-03-07 MED ORDER — ONDANSETRON 4 MG PO TBDP: ORAL_TABLET | ORAL | Status: DC

## 2015-03-07 MED ORDER — PANTOPRAZOLE SODIUM 40 MG IV SOLR
40.0000 mg | Freq: Once | INTRAVENOUS | Status: AC
  Administered 2015-03-07: 40 mg via INTRAVENOUS
  Filled 2015-03-07: qty 40

## 2015-03-07 MED ORDER — IOHEXOL 300 MG/ML  SOLN
100.0000 mL | Freq: Once | INTRAMUSCULAR | Status: AC | PRN
  Administered 2015-03-07: 100 mL via INTRAVENOUS

## 2015-03-07 MED ORDER — PANTOPRAZOLE SODIUM 20 MG PO TBEC: 20.0000 mg | DELAYED_RELEASE_TABLET | Freq: Every day | ORAL | Status: DC

## 2015-03-07 MED ORDER — KETOROLAC TROMETHAMINE 30 MG/ML IJ SOLN
30.0000 mg | Freq: Once | INTRAMUSCULAR | Status: AC
  Administered 2015-03-07: 30 mg via INTRAVENOUS
  Filled 2015-03-07: qty 1

## 2015-03-07 MED ORDER — IOHEXOL 300 MG/ML  SOLN
25.0000 mL | Freq: Once | INTRAMUSCULAR | Status: AC | PRN
  Administered 2015-03-07: 25 mL via ORAL

## 2015-03-07 MED ORDER — TRAMADOL HCL 50 MG PO TABS: 50.0000 mg | ORAL_TABLET | Freq: Four times a day (QID) | ORAL | Status: DC | PRN

## 2015-03-07 NOTE — ED Notes (Signed)
Pt requesting something for pain that is non-narcotic; Dr. Estell Harpin notified and pt given option of rectal tylenol, a narcotic or wait for he CT results to come back; pt chooses to wait for CT results; Dr. Estell Harpin informed

## 2015-03-07 NOTE — ED Provider Notes (Signed)
CSN: [id number]     Arrival date & time 03/07/15  2003 History   This chart was scribed for Bethann Berkshire, MD by Abel Presto, ED Scribe. This patient was seen in room APA02/APA02 and the patient's care was started at 8:24 PM.    Chief Complaint  Patient presents with  . Abdominal Pain     Patient is a 39 y.o. female presenting with abdominal pain. The history is provided by the patient. No language interpreter was used.  Abdominal Pain Pain location:  Epigastric Pain quality: sharp   Pain radiates to:  Does not radiate Pain severity:  Moderate Onset quality:  Sudden Duration:  1 day Timing:  Intermittent Progression:  Unchanged Chronicity:  New Relieved by:  None tried Worsened by:  Nothing tried Ineffective treatments:  None tried Associated symptoms: chills, nausea and vomiting   Associated symptoms: no chest pain, no cough, no diarrhea, no fatigue, no fever and no hematuria   Vomiting:    Quality:  Unable to specify   Number of occurrences:  1   Severity:  Mild   Duration:  1 day   Timing:  Rare   Progression:  Resolved  HPI Comments: Sally Bennett is a 39 y.o. female who presents to the Emergency Department complaining of intermittent sharp abdominal pain with onset. Pt notes associated chills and vomiting once.  She denies similar pain in past. She has not h/o abdominal surgery. Her LNMP was 3 weeks ago. Pt denies fever and diarrhea.   Past Medical History  Diagnosis Date  . Hepatitis C    History reviewed. No pertinent past surgical history. History reviewed. No pertinent family history. History  Substance Use Topics  . Smoking status: Current Every Day Smoker  . Smokeless tobacco: Not on file  . Alcohol Use: No   OB History    No data available     Review of Systems  Constitutional: Positive for chills. Negative for fever, appetite change and fatigue.  HENT: Negative for congestion, ear discharge and sinus pressure.   Eyes: Negative for discharge.   Respiratory: Negative for cough.   Cardiovascular: Negative for chest pain.  Gastrointestinal: Positive for nausea, vomiting and abdominal pain. Negative for diarrhea.  Genitourinary: Negative for frequency and hematuria.  Musculoskeletal: Negative for back pain.  Skin: Negative for rash.  Neurological: Negative for seizures and headaches.  Psychiatric/Behavioral: Negative for hallucinations.      Allergies  Review of patient's allergies indicates no known allergies.  Home Medications   Prior to Admission medications   Not on File   BP 139/86 mmHg  Pulse 101  Temp(Src) 99 F (37.2 C) (Oral)  Resp 20  Ht  (1.651 m)  Wt 142 lb (64.411 kg)  BMI 23.63 kg/m2  SpO2 100%  LMP 02/14/2015 Physical Exam  Constitutional: She is oriented to person, place, and time. She appears well-developed.  HENT:  Head: Normocephalic.  Eyes: Conjunctivae and EOM are normal. No scleral icterus.  Neck: Neck supple. No thyromegaly present.  Cardiovascular: Normal rate and regular rhythm.  Exam reveals no gallop and no friction rub.   No murmur heard. Pulmonary/Chest: No stridor. She has no wheezes. She has no rales. She exhibits no tenderness.  Abdominal: She exhibits no distension. There is tenderness (moderate) in the epigastric area. There is no rebound.  Musculoskeletal: Normal range of motion. She exhibits no edema.  Lymphadenopathy:    She has no cervical adenopathy.  Neurological: She is alert and oriented to person,  place, and time. She exhibits normal muscle tone. Coordination normal.  Skin: No rash noted. No erythema.  Psychiatric: She has a normal mood and affect. Her behavior is normal.  Nursing note and vitals reviewed.   ED Course  Procedures (including critical care time) DIAGNOSTIC STUDIES: Oxygen Saturation is 100% on room air, normal by my interpretation.    COORDINATION OF CARE: 8:26 PM Discussed treatment plan with patient at beside, the patient agrees with the  plan and has no further questions at this time.   Labs Review Labs Reviewed  URINALYSIS, ROUTINE W REFLEX MICROSCOPIC (NOT AT [facility])    Imaging Review No results found.   EKG Interpretation None      MDM   Final diagnoses:  None    abd pain with nl ct abd.  Pt to return for [place] in the am.  If [place] neg,  Pt to follow up with gi dr. Kelvin Cellar,  tx with ultram,  zofran and protonix  The chart was scribed for me under my direct supervision.  I personally performed the history, physical, and medical decision making and all procedures in the evaluation of this patient.Bethann Berkshire, MD 03/07/15 2325

## 2015-03-07 NOTE — ED Notes (Signed)
Pt c/o abdominal pain and nausea since early this morning.

## 2015-03-08 ENCOUNTER — Telehealth: Payer: Self-pay | Admitting: *Deleted

## 2015-03-08 ENCOUNTER — Ambulatory Visit (HOSPITAL_COMMUNITY)
Admit: 2015-03-08 | Discharge: 2015-03-08 | Disposition: A | Discharge status: Home / Self Care | Payer: BLUE CROSS/BLUE SHIELD | Source: Ambulatory Visit | Attending: Emergency Medicine | Admitting: Emergency Medicine

## 2015-03-08 ENCOUNTER — Other Ambulatory Visit (HOSPITAL_COMMUNITY): Payer: Self-pay | Admitting: Emergency Medicine

## 2015-03-08 DIAGNOSIS — R1013 Epigastric pain: Secondary | ICD-10-CM | POA: Diagnosis present

## 2015-03-08 NOTE — ED Provider Notes (Signed)
[place] reviewed. Cholelithiasis. May be causing her symptoms. Prescribed meds yesterday. Provided with Gen Surgery contacts information.   Raeford Razor, MD 03/11/15 [phone]

## 2015-03-08 NOTE — Telephone Encounter (Signed)
Pt called stating she was seen in the ER and they ER told her to follow up with Dr. Karilyn Cota, pt hasn't seen Dr. Karilyn Cota but has heard things about him and she does not want to see him, pt wants to see Dr. Darrick Penna. Pt states the ER did not find anything but gall stones, pt states the ER told her to follow up with Dr. Karilyn Cota to find out if she needs to have her gall bladder removed right now. Pt states she has [facility]. Pt called the ER back to have the referral sent to Dr. Darrick Penna because it has Dr. Cathie Beams name on it and pt stated Darel Hong the RN will be giving is a call. Can we see this pt?

## 2015-03-08 NOTE — Telephone Encounter (Signed)
Looks like the plan from the ED was to have her follow-up with GI IF the ultrasound was negative. It showed a gallstone.   Appears she was to see General Surgery. Would start there. We can see her as a new patient IF general surgery doesn't think gallbladder is the issue.

## 2015-03-08 NOTE — Telephone Encounter (Signed)
I made pt aware to follow up with Dr. Lovell Sheehan.

## 2015-04-05 NOTE — H&P (Signed)
  NTS SOAP Note  Vital Signs:  Vitals as of: 04/04/2015: Systolic 118: Diastolic 80: Heart Rate 96: Temp 98.53F: Height 455ft 4in: Weight 139Lbs 0 Ounces: BMI 23.86  BMI : 23.86 kg/m2  Subjective: This 39 year old female presents for of abdominal pain.  Has had two episodes of right upper quadrant abdominal pain with radiation to the right flank, nausea, and bloating over the past two months.  Made worse with fatty food.  No fever, chills, jaundice.  Was told she had hepatitis C while in jail, but has not had any further workup.  Will be seeing Dr. Darrick PennaFields, GI, for further workup.  Review of Symptoms:  Constitutional:unremarkable   Head:unremarkable Eyes:unremarkable   Nose/Mouth/Throat:unremarkable Cardiovascular:  unremarkable Respiratory:unremarkable Gastrointestinabdominal pain, nausea, heartburn Genitourinary:unremarkable   Musculoskeletal:unremarkable Skin:unremarkable Hematolgic/Lymphatic:unremarkable   Allergic/Immunologic:unremarkable   Past Medical History:  Reviewed  Past Medical History  Surgical History: none Medical Problems: hepatitis C Allergies: nkda Medications: none   Social History:Reviewed  Social History  Preferred Language: English Race:  White Ethnicity: Not Hispanic / Latino Age: 1935 year Marital Status:  S Alcohol: unknown   Smoking Status: Current every day smoker reviewed on 04/04/2015 Started Date:  Packs per day: 1.00 Functional Status reviewed on 04/04/2015 ------------------------------------------------ Bathing: Normal Cooking: Normal Dressing: Normal Driving: Normal Eating: Normal Managing Meds: Normal Oral Care: Normal Shopping: Normal Toileting: Normal Transferring: Normal Walking: Normal Cognitive Status reviewed on 04/04/2015 ------------------------------------------------ Attention: Normal Decision Making: Normal Language: Normal Memory: Normal Motor: Normal Perception: Normal Problem  Solving: Normal Visual and Spatial: Normal   Family History:Reviewed  Family Health History Family History is Unknown    Objective Information: General:Well appearing, well nourished in no distress. no scleral icterus Heart:RRR, no murmur Lungs:  CTA bilaterally, no wheezes, rhonchi, rales.  Breathing unlabored. Abdomen:Soft, NT/ND, no HSM, no masses.  Assessment:Cholecystitis, choleithiasis, hepatitis C  Diagnoses: 574.00  K80.00 Calculus of gallbladder with acute cholecystitis (Calculus of gallbladder with acute cholecystitis without obstruction)  Procedures: [id number] - OFFICE OUTPATIENT NEW 30 MINUTES    Plan:  Scheduled for laparoscopic cholecystectomy, liver biopsy on 04/28/15.   Patient Education:Alternative treatments to surgery were discussed with patient (and family).  Risks and benefits  of procedure including bleeding, infection, heptobiliary injury, and the possibility of an open proceudre were fully explained to the patient (and family) who gave informed consent. Patient/family questions were addressed.  Follow-up:Pending Surgery

## 2015-04-19 NOTE — Patient Instructions (Signed)
Sally Bennett  04/19/2015     @PREFPERIOPPHARMACY @   Your procedure is scheduled on 04/28/2015  Report to [facility] at  615  A.M.  Call this number if you have problems the morning of surgery:  [phone]   Remember:  Do not eat food or drink liquids after midnight.  Take these medicines the morning of surgery with A SIP OF WATER  Zofran, protonix, ultram.   Do not wear jewelry, make-up or nail polish.  Do not wear lotions, powders, or perfumes.    Do not shave 48 hours prior to surgery.  Men may shave face and neck.  Do not bring valuables to the hospital.  [facility] is not responsible for any belongings or valuables.  Contacts, dentures or bridgework may not be worn into surgery.  Leave your suitcase in the car.  After surgery it may be brought to your room.  For patients admitted to the hospital, discharge time will be determined by your treatment team.  Patients discharged the day of surgery will not be allowed to drive home.   Name and phone number of your driver:   family Special instructions:  none  Please read over the following fact sheets that you were given. Pain Booklet, Coughing and Deep Breathing, Surgical Site Infection Prevention, Anesthesia Post-op Instructions and Care and Recovery After Surgery      Laparoscopic Cholecystectomy Laparoscopic cholecystectomy is surgery to remove the gallbladder. The gallbladder is located in the upper right part of the abdomen, behind the liver. It is a storage sac for bile produced in the liver. Bile aids in the digestion and absorption of fats. Cholecystectomy is often done for inflammation of the gallbladder (cholecystitis). This condition is usually caused by a buildup of gallstones (cholelithiasis) in your gallbladder. Gallstones can block the flow of bile, resulting in inflammation and pain. In severe cases, emergency surgery may be required. When emergency surgery is not required, you will have time to  prepare for the procedure. Laparoscopic surgery is an alternative to open surgery. Laparoscopic surgery has a shorter recovery time. Your common bile duct may also need to be examined during the procedure. If stones are found in the common bile duct, they may be removed. LET [facility] CARE PROVIDER KNOW ABOUT:  Any allergies you have.  All medicines you are taking, including vitamins, herbs, eye drops, creams, and over-the-counter medicines.  Previous problems you or members of your family have had with the use of anesthetics.  Any blood disorders you have.  Previous surgeries you have had.  Medical conditions you have. RISKS AND COMPLICATIONS Generally, this is a safe procedure. However, as with any procedure, complications can occur. Possible complications include:  Infection.  Damage to the common bile duct, nerves, arteries, veins, or other internal organs such as the stomach, liver, or intestines.  Bleeding.  A stone may remain in the common bile duct.  A bile leak from the cyst duct that is clipped when your gallbladder is removed.  The need to convert to open surgery, which requires a larger incision in the abdomen. This may be necessary if your surgeon thinks it is not safe to continue with a laparoscopic procedure. BEFORE THE PROCEDURE  Ask your health care provider about changing or stopping any regular medicines. You will need to stop taking aspirin or blood thinners at least 5 days prior to surgery.  Do not eat or drink anything after midnight the night  before surgery.  Let your health care provider know if you develop a cold or other infectious problem before surgery. PROCEDURE   You will be given medicine to make you sleep through the procedure (general anesthetic). A breathing tube will be placed in your mouth.  When you are asleep, your surgeon will make several small cuts (incisions) in your abdomen.  A thin, lighted tube with a tiny camera on the end  (laparoscope) is inserted through one of the small incisions. The camera on the laparoscope sends a picture to a TV screen in the operating room. This gives the surgeon a good view inside your abdomen.  A gas will be pumped into your abdomen. This expands your abdomen so that the surgeon has more room to perform the surgery.  Other tools needed for the procedure are inserted through the other incisions. The gallbladder is removed through one of the incisions.  After the removal of your gallbladder, the incisions will be closed with stitches, staples, or skin glue. AFTER THE PROCEDURE  You will be taken to a recovery area where your progress will be checked often.  You may be allowed to go home the same day if your pain is controlled and you can tolerate liquids. Document Released: 09/02/2005 Document Revised: 06/23/2013 Document Reviewed: 04/14/2013 [facility] Patient Information 2015 [place], [place]. This information is not intended to replace advice given to you by your health care provider. Make sure you discuss any questions you have with your health care provider. PATIENT INSTRUCTIONS POST-ANESTHESIA  IMMEDIATELY FOLLOWING SURGERY:  Do not drive or operate machinery for the first twenty four hours after surgery.  Do not make any important decisions for twenty four hours after surgery or while taking narcotic pain medications or sedatives.  If you develop intractable nausea and vomiting or a severe headache please notify your doctor immediately.  FOLLOW-UP:  Please make an appointment with your surgeon as instructed. You do not need to follow up with anesthesia unless specifically instructed to do so.  WOUND CARE INSTRUCTIONS (if applicable):  Keep a dry clean dressing on the anesthesia/puncture wound site if there is drainage.  Once the wound has quit draining you may leave it open to air.  Generally you should leave the bandage intact for twenty four hours unless there is drainage.  If the  epidural site drains for more than 36-48 hours please call the anesthesia department.  QUESTIONS?:  Please feel free to call your physician or the hospital operator if you have any questions, and they will be happy to assist you.

## 2015-04-21 ENCOUNTER — Encounter (HOSPITAL_COMMUNITY)
Admission: RE | Admit: 2015-04-21 | Discharge: 2015-04-21 | Disposition: A | Discharge status: Home / Self Care | Payer: BLUE CROSS/BLUE SHIELD | Source: Ambulatory Visit | Attending: General Surgery | Admitting: General Surgery

## 2015-04-26 ENCOUNTER — Encounter (HOSPITAL_COMMUNITY): Payer: Self-pay

## 2015-04-26 ENCOUNTER — Encounter (HOSPITAL_COMMUNITY)
Admission: RE | Admit: 2015-04-26 | Discharge: 2015-04-26 | Disposition: A | Discharge status: Home / Self Care | Payer: BLUE CROSS/BLUE SHIELD | Source: Ambulatory Visit | Attending: General Surgery | Admitting: General Surgery

## 2015-04-26 DIAGNOSIS — K8010 Calculus of gallbladder with chronic cholecystitis without obstruction: Secondary | ICD-10-CM | POA: Diagnosis not present

## 2015-04-26 DIAGNOSIS — K760 Fatty (change of) liver, not elsewhere classified: Secondary | ICD-10-CM | POA: Diagnosis not present

## 2015-04-26 DIAGNOSIS — B1920 Unspecified viral hepatitis C without hepatic coma: Secondary | ICD-10-CM | POA: Diagnosis not present

## 2015-04-26 HISTORY — DX: Unspecified viral hepatitis C without hepatic coma: B19.20

## 2015-04-26 LAB — CBC WITH DIFFERENTIAL/PLATELET
BASOS ABS: 0 10*3/uL (ref 0.0–0.1)
BASOS PCT: 0 % (ref 0–1)
EOS PCT: 5 % (ref 0–5)
Eosinophils Absolute: 0.6 10*3/uL (ref 0.0–0.7)
HCT: 42.1 % (ref 36.0–46.0)
Hemoglobin: 14.1 g/dL (ref 12.0–15.0)
LYMPHS PCT: 29 % (ref 12–46)
Lymphs Abs: 3.3 10*3/uL (ref 0.7–4.0)
MCH: 28.7 pg (ref 26.0–34.0)
MCHC: 33.5 g/dL (ref 30.0–36.0)
MCV: 85.7 fL (ref 78.0–100.0)
Monocytes Absolute: 0.7 10*3/uL (ref 0.1–1.0)
Monocytes Relative: 6 % (ref 3–12)
Neutro Abs: 6.9 10*3/uL (ref 1.7–7.7)
Neutrophils Relative %: 60 % (ref 43–77)
PLATELETS: 189 10*3/uL (ref 150–400)
RBC: 4.91 MIL/uL (ref 3.87–5.11)
RDW: 13.4 % (ref 11.5–15.5)
WBC: 11.5 10*3/uL — AB (ref 4.0–10.5)

## 2015-04-26 LAB — BASIC METABOLIC PANEL
ANION GAP: 7 (ref 5–15)
BUN: 10 mg/dL (ref 6–20)
CO2: 30 mmol/L (ref 22–32)
Calcium: 9.3 mg/dL (ref 8.9–10.3)
Chloride: 103 mmol/L (ref 101–111)
Creatinine, Ser: 0.67 mg/dL (ref 0.44–1.00)
Glucose, Bld: 94 mg/dL (ref 65–99)
POTASSIUM: 3.7 mmol/L (ref 3.5–5.1)
Sodium: 140 mmol/L (ref 135–145)

## 2015-04-26 LAB — HCG, SERUM, QUALITATIVE: Preg, Serum: NEGATIVE

## 2015-04-26 LAB — HEPATIC FUNCTION PANEL
ALBUMIN: 4 g/dL (ref 3.5–5.0)
ALK PHOS: 116 U/L (ref 38–126)
ALT: 41 U/L (ref 14–54)
AST: 41 U/L (ref 15–41)
BILIRUBIN TOTAL: 0.4 mg/dL (ref 0.3–1.2)
Bilirubin, Direct: 0.1 mg/dL (ref 0.1–0.5)
Indirect Bilirubin: 0.3 mg/dL (ref 0.3–0.9)
TOTAL PROTEIN: 7.3 g/dL (ref 6.5–8.1)

## 2015-04-28 ENCOUNTER — Encounter (HOSPITAL_COMMUNITY)
Admission: RE | Disposition: A | Discharge status: Home / Self Care | Payer: Self-pay | Source: Ambulatory Visit | Attending: General Surgery

## 2015-04-28 ENCOUNTER — Ambulatory Visit (HOSPITAL_COMMUNITY): Payer: BLUE CROSS/BLUE SHIELD | Admitting: Anesthesiology

## 2015-04-28 ENCOUNTER — Ambulatory Visit (HOSPITAL_COMMUNITY)
Admission: RE | Admit: 2015-04-28 | Discharge: 2015-04-28 | Disposition: A | Discharge status: Home / Self Care | Payer: BLUE CROSS/BLUE SHIELD | Source: Ambulatory Visit | Attending: General Surgery | Admitting: General Surgery

## 2015-04-28 ENCOUNTER — Encounter (HOSPITAL_COMMUNITY): Payer: Self-pay | Admitting: *Deleted

## 2015-04-28 DIAGNOSIS — K8010 Calculus of gallbladder with chronic cholecystitis without obstruction: Secondary | ICD-10-CM | POA: Insufficient documentation

## 2015-04-28 DIAGNOSIS — K760 Fatty (change of) liver, not elsewhere classified: Secondary | ICD-10-CM | POA: Insufficient documentation

## 2015-04-28 DIAGNOSIS — B1920 Unspecified viral hepatitis C without hepatic coma: Secondary | ICD-10-CM | POA: Insufficient documentation

## 2015-04-28 HISTORY — PX: CHOLECYSTECTOMY: SHX55

## 2015-04-28 HISTORY — DX: Gastro-esophageal reflux disease without esophagitis: K21.9

## 2015-04-28 HISTORY — PX: LIVER BIOPSY: SHX301

## 2015-04-28 SURGERY — LAPAROSCOPIC CHOLECYSTECTOMY
Anesthesia: General

## 2015-04-28 MED ORDER — KETOROLAC TROMETHAMINE 30 MG/ML IJ SOLN
INTRAMUSCULAR | Status: AC
  Filled 2015-04-28: qty 1

## 2015-04-28 MED ORDER — BUPIVACAINE HCL (PF) 0.5 % IJ SOLN
INTRAMUSCULAR | Status: DC | PRN
  Administered 2015-04-28: 10 mL

## 2015-04-28 MED ORDER — MIDAZOLAM HCL 2 MG/2ML IJ SOLN
1.0000 mg | INTRAMUSCULAR | Status: DC | PRN
Start: 2015-04-28 — End: 2015-04-28
  Administered 2015-04-28: 2 mg via INTRAVENOUS
  Filled 2015-04-28: qty 2

## 2015-04-28 MED ORDER — PROPOFOL 10 MG/ML IV BOLUS
INTRAVENOUS | Status: AC
  Filled 2015-04-28: qty 20

## 2015-04-28 MED ORDER — KETOROLAC TROMETHAMINE 30 MG/ML IJ SOLN
30.0000 mg | Freq: Once | INTRAMUSCULAR | Status: AC
  Administered 2015-04-28: 30 mg via INTRAVENOUS

## 2015-04-28 MED ORDER — POVIDONE-IODINE 10 % OINT PACKET
TOPICAL_OINTMENT | CUTANEOUS | Status: DC | PRN
  Administered 2015-04-28: 1 via TOPICAL

## 2015-04-28 MED ORDER — FENTANYL CITRATE (PF) 250 MCG/5ML IJ SOLN
INTRAMUSCULAR | Status: DC | PRN
  Administered 2015-04-28 (×4): 50 ug via INTRAVENOUS
  Administered 2015-04-28 (×2): 25 ug via INTRAVENOUS
  Administered 2015-04-28 (×2): 50 ug via INTRAVENOUS

## 2015-04-28 MED ORDER — LACTATED RINGERS IV SOLN
INTRAVENOUS | Status: DC
  Administered 2015-04-28: 07:00:00 via INTRAVENOUS

## 2015-04-28 MED ORDER — CIPROFLOXACIN IN D5W 400 MG/200ML IV SOLN
400.0000 mg | INTRAVENOUS | Status: AC
  Administered 2015-04-28: 400 mg via INTRAVENOUS
  Filled 2015-04-28: qty 200

## 2015-04-28 MED ORDER — DEXAMETHASONE SODIUM PHOSPHATE 4 MG/ML IJ SOLN
INTRAMUSCULAR | Status: AC
  Filled 2015-04-28: qty 1

## 2015-04-28 MED ORDER — MIDAZOLAM HCL 2 MG/2ML IJ SOLN
INTRAMUSCULAR | Status: DC | PRN
  Administered 2015-04-28: 2 mg via INTRAVENOUS

## 2015-04-28 MED ORDER — SODIUM CHLORIDE 0.9 % IR SOLN
Status: DC | PRN
Start: 2015-04-28 — End: 2015-04-28
  Administered 2015-04-28: 1000 mL

## 2015-04-28 MED ORDER — SUCCINYLCHOLINE CHLORIDE 20 MG/ML IJ SOLN
INTRAMUSCULAR | Status: AC
  Filled 2015-04-28: qty 1

## 2015-04-28 MED ORDER — LIDOCAINE HCL (PF) 1 % IJ SOLN
INTRAMUSCULAR | Status: AC
  Filled 2015-04-28: qty 5

## 2015-04-28 MED ORDER — OXYCODONE-ACETAMINOPHEN 7.5-325 MG PO TABS: 1.0000 | ORAL_TABLET | ORAL | Status: DC | PRN

## 2015-04-28 MED ORDER — FENTANYL CITRATE (PF) 100 MCG/2ML IJ SOLN
25.0000 ug | INTRAMUSCULAR | Status: DC | PRN
Start: 2015-04-28 — End: 2015-04-28
  Administered 2015-04-28 (×2): 50 ug via INTRAVENOUS
  Administered 2015-04-28 (×2): 25 ug via INTRAVENOUS
  Administered 2015-04-28: 50 ug via INTRAVENOUS
  Filled 2015-04-28: qty 2

## 2015-04-28 MED ORDER — POVIDONE-IODINE 10 % EX OINT
TOPICAL_OINTMENT | CUTANEOUS | Status: AC
  Filled 2015-04-28: qty 1

## 2015-04-28 MED ORDER — ONDANSETRON HCL 4 MG/2ML IJ SOLN
INTRAMUSCULAR | Status: AC
  Filled 2015-04-28: qty 2

## 2015-04-28 MED ORDER — LIDOCAINE HCL 1 % IJ SOLN
INTRAMUSCULAR | Status: DC | PRN
  Administered 2015-04-28: 40 mg via INTRADERMAL

## 2015-04-28 MED ORDER — CHLORHEXIDINE GLUCONATE 4 % EX LIQD: 1.0000 "application " | Freq: Once | CUTANEOUS | Status: DC

## 2015-04-28 MED ORDER — ROCURONIUM BROMIDE 100 MG/10ML IV SOLN
INTRAVENOUS | Status: DC | PRN
  Administered 2015-04-28: 5 mg via INTRAVENOUS
  Administered 2015-04-28: 25 mg via INTRAVENOUS

## 2015-04-28 MED ORDER — ROCURONIUM BROMIDE 50 MG/5ML IV SOLN
INTRAVENOUS | Status: AC
  Filled 2015-04-28: qty 1

## 2015-04-28 MED ORDER — GLYCOPYRROLATE 0.2 MG/ML IJ SOLN
INTRAMUSCULAR | Status: DC | PRN
  Administered 2015-04-28: .7 mg via INTRAVENOUS

## 2015-04-28 MED ORDER — DEXAMETHASONE SODIUM PHOSPHATE 4 MG/ML IJ SOLN
4.0000 mg | Freq: Once | INTRAMUSCULAR | Status: AC
  Administered 2015-04-28: 4 mg via INTRAVENOUS

## 2015-04-28 MED ORDER — ONDANSETRON HCL 4 MG/2ML IJ SOLN: 4.0000 mg | Freq: Once | INTRAMUSCULAR | Status: DC | PRN

## 2015-04-28 MED ORDER — NEOSTIGMINE METHYLSULFATE 10 MG/10ML IV SOLN
INTRAVENOUS | Status: AC
  Filled 2015-04-28: qty 1

## 2015-04-28 MED ORDER — PROPOFOL 10 MG/ML IV BOLUS
INTRAVENOUS | Status: DC | PRN
  Administered 2015-04-28: 130 mg via INTRAVENOUS

## 2015-04-28 MED ORDER — NEOSTIGMINE METHYLSULFATE 10 MG/10ML IV SOLN
INTRAVENOUS | Status: DC | PRN
  Administered 2015-04-28: 3.5 mg via INTRAVENOUS

## 2015-04-28 MED ORDER — FENTANYL CITRATE (PF) 100 MCG/2ML IJ SOLN
INTRAMUSCULAR | Status: AC
  Filled 2015-04-28: qty 4

## 2015-04-28 MED ORDER — HEMOSTATIC AGENTS (NO CHARGE) OPTIME
TOPICAL | Status: DC | PRN
  Administered 2015-04-28: 1 via TOPICAL

## 2015-04-28 MED ORDER — MIDAZOLAM HCL 2 MG/2ML IJ SOLN
INTRAMUSCULAR | Status: AC
  Filled 2015-04-28: qty 4

## 2015-04-28 MED ORDER — BUPIVACAINE HCL (PF) 0.5 % IJ SOLN
INTRAMUSCULAR | Status: AC
  Filled 2015-04-28: qty 30

## 2015-04-28 MED ORDER — SUCCINYLCHOLINE CHLORIDE 20 MG/ML IJ SOLN
INTRAMUSCULAR | Status: DC | PRN
  Administered 2015-04-28: 100 mg via INTRAVENOUS

## 2015-04-28 MED ORDER — ONDANSETRON HCL 4 MG/2ML IJ SOLN
4.0000 mg | Freq: Once | INTRAMUSCULAR | Status: AC
  Administered 2015-04-28: 4 mg via INTRAVENOUS

## 2015-04-28 MED ORDER — FENTANYL CITRATE (PF) 100 MCG/2ML IJ SOLN
INTRAMUSCULAR | Status: AC
  Filled 2015-04-28: qty 2

## 2015-04-28 MED ORDER — MIDAZOLAM HCL 2 MG/2ML IJ SOLN
INTRAMUSCULAR | Status: AC
  Filled 2015-04-28: qty 2

## 2015-04-28 MED ORDER — GLYCOPYRROLATE 0.2 MG/ML IJ SOLN
INTRAMUSCULAR | Status: AC
  Filled 2015-04-28: qty 2

## 2015-04-28 MED ORDER — FENTANYL CITRATE (PF) 250 MCG/5ML IJ SOLN
INTRAMUSCULAR | Status: AC
  Filled 2015-04-28: qty 25

## 2015-04-28 MED ORDER — NEOSTIGMINE METHYLSULFATE 10 MG/10ML IV SOLN: INTRAVENOUS | Status: DC | PRN

## 2015-04-28 SURGICAL SUPPLY — 49 items
APPLIER CLIP LAPSCP 10X32 DD (CLIP) ×2 IMPLANT
BAG HAMPER (MISCELLANEOUS) ×2 IMPLANT
BAG SPEC RTRVL LRG 6X4 10 (ENDOMECHANICALS) ×1
CHLORAPREP W/TINT 26ML (MISCELLANEOUS) ×2 IMPLANT
CLOTH BEACON ORANGE TIMEOUT ST (SAFETY) ×2 IMPLANT
COVER LIGHT HANDLE STERIS (MISCELLANEOUS) ×4 IMPLANT
DECANTER SPIKE VIAL GLASS SM (MISCELLANEOUS) ×2 IMPLANT
ELECT REM PT RETURN 9FT ADLT (ELECTROSURGICAL) ×2
ELECTRODE REM PT RTRN 9FT ADLT (ELECTROSURGICAL) ×1 IMPLANT
FILTER SMOKE EVAC LAPAROSHD (FILTER) ×2 IMPLANT
FORMALIN 10 PREFIL 120ML (MISCELLANEOUS) ×3 IMPLANT
GLOVE BIOGEL M 7.0 STRL (GLOVE) ×2 IMPLANT
GLOVE BIOGEL M STRL SZ7.5 (GLOVE) ×1 IMPLANT
GLOVE BIOGEL PI IND STRL 7.0 (GLOVE) IMPLANT
GLOVE BIOGEL PI IND STRL 7.5 (GLOVE) IMPLANT
GLOVE BIOGEL PI INDICATOR 7.0 (GLOVE) ×2
GLOVE BIOGEL PI INDICATOR 7.5 (GLOVE) ×1
GLOVE SURG SS PI 7.5 STRL IVOR (GLOVE) ×4 IMPLANT
GOWN STRL REUS W/ TWL XL LVL3 (GOWN DISPOSABLE) ×1 IMPLANT
GOWN STRL REUS W/TWL LRG LVL3 (GOWN DISPOSABLE) ×6 IMPLANT
GOWN STRL REUS W/TWL XL LVL3 (GOWN DISPOSABLE) ×2
HEMOSTAT SNOW SURGICEL 2X4 (HEMOSTASIS) ×2 IMPLANT
INST SET LAPROSCOPIC AP (KITS) ×2 IMPLANT
KIT ROOM TURNOVER APOR (KITS) ×2 IMPLANT
MANIFOLD NEPTUNE II (INSTRUMENTS) ×2 IMPLANT
NDL BIOPSY 14X6 SOFT TISS (NEEDLE) IMPLANT
NDL HYPO 25X1 1.5 SAFETY (NEEDLE) ×1 IMPLANT
NDL INSUFFLATION 14GA 120MM (NEEDLE) ×1 IMPLANT
NEEDLE BIOPSY 14X6 SOFT TISS (NEEDLE) ×2 IMPLANT
NEEDLE HYPO 25X1 1.5 SAFETY (NEEDLE) ×2 IMPLANT
NEEDLE INSUFFLATION 14GA 120MM (NEEDLE) ×2 IMPLANT
NS IRRIG 1000ML POUR BTL (IV SOLUTION) ×2 IMPLANT
PACK LAP CHOLE LZT030E (CUSTOM PROCEDURE TRAY) ×2 IMPLANT
PAD ARMBOARD 7.5X6 YLW CONV (MISCELLANEOUS) ×2 IMPLANT
PAD TELFA 3X4 1S STER (GAUZE/BANDAGES/DRESSINGS) ×1 IMPLANT
POUCH SPECIMEN RETRIEVAL 10MM (ENDOMECHANICALS) ×2 IMPLANT
SET BASIN LINEN APH (SET/KITS/TRAYS/PACK) ×2 IMPLANT
SLEEVE ENDOPATH XCEL 5M (ENDOMECHANICALS) ×2 IMPLANT
SPONGE GAUZE 2X2 8PLY STRL LF (GAUZE/BANDAGES/DRESSINGS) ×8 IMPLANT
STAPLER VISISTAT (STAPLE) ×2 IMPLANT
SUT VICRYL 0 UR6 27IN ABS (SUTURE) ×2 IMPLANT
SYRINGE 10CC LL (SYRINGE) ×2 IMPLANT
TAPE CLOTH SURG 4X10 WHT LF (GAUZE/BANDAGES/DRESSINGS) ×1 IMPLANT
TROCAR ENDO BLADELESS 11MM (ENDOMECHANICALS) ×2 IMPLANT
TROCAR XCEL NON-BLD 5MMX100MML (ENDOMECHANICALS) ×2 IMPLANT
TROCAR XCEL UNIV SLVE 11M 100M (ENDOMECHANICALS) ×2 IMPLANT
TUBING INSUFFLATION (TUBING) ×2 IMPLANT
WARMER LAPAROSCOPE (MISCELLANEOUS) ×2 IMPLANT
YANKAUER SUCT 12FT TUBE ARGYLE (SUCTIONS) ×2 IMPLANT

## 2015-04-28 NOTE — Discharge Instructions (Signed)

## 2015-04-28 NOTE — Op Note (Addendum)
Patient:  Sally Bennett  DOB:  1976-06-01  MRN:  [id number]   Preop Diagnosis:  Cholecystitis, cholelithiasis, hepatitis C  Postop Diagnosis:  Same  Procedure:  Laparoscopic cholecystectomy, liver biopsy  Surgeon:  Franky Macho, M.D.  Anes:  Gen. endotracheal  Indications:  Patient is a 39 year old white female who presents with cholecystitis secondary to cholelithiasis. She is also been told that she tested positive for hepatitis C in the past. The risks and benefits of both procedures including bleeding, infection, hepatobiliary injury, and the possibility of an open procedure were fully explained to the patient, who gave informed consent.  Procedure note:  The patient was placed the supine position. After induction of general endotracheal anesthesia, the abdomen was prepped and draped using the usual sterile technique with DuraPrep. Surgical site confirmation was performed.  An infraumbilical incision was made down to the fascia. A Veress needle was introduced into the abdominal cavity and confirmation of placement was done using the saline drop test. The abdomen was then insufflated to 16 mmHg pressure. An 11 mm trocar was introduced into the abdominal cavity under direct visualization without difficulty. The patient was placed in reverse Trendelenburg position and an additional 11 mm trocar was placed the epigastric region and 5 mm trochars were placed the right upper quadrant and right flank regions. Liver was inspected and noted to be within normal limits. The gallbladder was retracted in a dynamic fashion in order to expose the triangle of Calot. The cystic duct was first identified. Its junction to the infundibulum was fully identified. Endoclips were placed proximally and distally along the cystic duct, and the cystic duct was divided. This was likewise done to the cystic artery. The gallbladder was freed away from the gallbladder fossa using Bovie electrocautery. The gallbladder was  delivered through the epigastric trocar site using an Endo Catch bag. The gallbladder fossa was inspected and no abnormal bleeding or bile leakage was noted. Surgicel was placed the gallbladder fossa. All fluid and air were then evacuated from the abdominal cavity prior to removal of the trochars.  All wounds were irrigated with normal saline. All wounds were checked with 0.5% Sensorcaine. The infraumbilical fashion as well as the epigastric fascia were reapproximated using 0 Vicryl interrupted sutures. All skin incisions were closed using staples. Betadine ointment and dry sterile dressings were applied.  All tape and needle counts were correct at the end of the procedure. Patient was extubated in the operating room and transferred to PACU in stable condition.  Complications:  None  EBL:  Minimal  Specimen:  Gallbladder, liver biopsy    Addendum:  A trucut core needle biopsy of the liver was performed.

## 2015-04-28 NOTE — Addendum Note (Signed)
Addendum  created 04/28/15 1153 by Moshe Salisbury, CRNA   Modules edited: Charges VN

## 2015-04-28 NOTE — Transfer of Care (Signed)
Immediate Anesthesia Transfer of Care Note  Patient: Sally Bennett  Procedure(s) Performed: Procedure(s): LAPAROSCOPIC CHOLECYSTECTOMY (N/A) LIVER BIOPSY (N/A)  Patient Location: PACU  Anesthesia Type:General  Level of Consciousness: awake  Airway & Oxygen Therapy: Patient Spontanous Breathing and Patient connected to face mask oxygen  Post-op Assessment: Report given to RN  Post vital signs: Reviewed and stable  Last Vitals:  Filed Vitals:   04/28/15 0715  BP: 148/84  Pulse:   Temp:   Resp: 22    Complications: No apparent anesthesia complications

## 2015-04-28 NOTE — Anesthesia Procedure Notes (Signed)
Procedure Name: Intubation Date/Time: 04/28/2015 7:42 AM Performed by: Glynn Octave E Pre-anesthesia Checklist: Patient identified, Patient being monitored, Timeout performed, Emergency Drugs available and Suction available Patient Re-evaluated:Patient Re-evaluated prior to inductionOxygen Delivery Method: Circle System Utilized Preoxygenation: Pre-oxygenation with 100% oxygen Intubation Type: IV induction, Rapid sequence and Cricoid Pressure applied Ventilation: Mask ventilation without difficulty Laryngoscope Size: Mac and 3 Grade View: Grade I Tube type: Oral Tube size: 7.0 mm Number of attempts: 1 Airway Equipment and Method: Stylet Placement Confirmation: ETT inserted through vocal cords under direct vision,  positive ETCO2 and breath sounds checked- equal and bilateral Secured at: 21 cm Tube secured with: Tape Dental Injury: Teeth and Oropharynx as per pre-operative assessment

## 2015-04-28 NOTE — Anesthesia Postprocedure Evaluation (Signed)
  Anesthesia Post-op Note  Patient: Sally Bennett  Procedure(s) Performed: Procedure(s): LAPAROSCOPIC CHOLECYSTECTOMY (N/A) LIVER BIOPSY (N/A)  Patient Location: PACU  Anesthesia Type:General  Level of Consciousness: awake, alert  and oriented  Airway and Oxygen Therapy: Patient Spontanous Breathing  Post-op Pain: mild  Post-op Assessment: Post-op Vital signs reviewed, Patient's Cardiovascular Status Stable, Respiratory Function Stable, Patent Airway and No signs of Nausea or vomiting              Post-op Vital Signs: Reviewed and stable  Last Vitals:  Filed Vitals:   04/28/15 0930  BP: 115/65  Pulse: 69  Temp: 36.6 C  Resp: 18    Complications: No apparent anesthesia complications

## 2015-04-28 NOTE — Interval H&P Note (Signed)
History and Physical Interval Note:  04/28/2015 7:12 AM  Sally Bennett  has presented today for surgery, with the diagnosis of cholelithiasis, hep. c  The various methods of treatment have been discussed with the patient and family. After consideration of risks, benefits and other options for treatment, the patient has consented to  Procedure(s): LAPAROSCOPIC CHOLECYSTECTOMY (N/A) LIVER BIOPSY (N/A) as a surgical intervention .  The patient's history has been reviewed, patient examined, no change in status, stable for surgery.  I have reviewed the patient's chart and labs.  Questions were answered to the patient's satisfaction.     Franky Macho A

## 2015-04-28 NOTE — Anesthesia Preprocedure Evaluation (Signed)
Anesthesia Evaluation  Patient identified by MRN, date of birth, ID band Patient awake    Reviewed: Allergy & Precautions, NPO status , Patient's Chart, lab work & pertinent test results, reviewed documented beta blocker date and time   Airway Mallampati: I  TM Distance: >3 FB     Dental  (+) Poor Dentition, Dental Advisory Given   Pulmonary Current Smoker,  breath sounds clear to auscultation        Cardiovascular negative cardio ROS  Rhythm:Regular Rate:Normal     Neuro/Psych    GI/Hepatic GERD-  ,(+)     substance abuse (in remission)  , Hepatitis -, C  Endo/Other    Renal/GU      Musculoskeletal   Abdominal   Peds  Hematology   Anesthesia Other Findings   Reproductive/Obstetrics                             Anesthesia Physical Anesthesia Plan  ASA: III  Anesthesia Plan: General   Post-op Pain Management:    Induction: Intravenous, Rapid sequence and Cricoid pressure planned  Airway Management Planned: Oral ETT  Additional Equipment:   Intra-op Plan:   Post-operative Plan: Extubation in OR  Informed Consent: I have reviewed the patients History and Physical, chart, labs and discussed the procedure including the risks, benefits and alternatives for the proposed anesthesia with the patient or authorized representative who has indicated his/her understanding and acceptance.     Plan Discussed with:   Anesthesia Plan Comments:         Anesthesia Quick Evaluation

## 2015-05-01 ENCOUNTER — Encounter (HOSPITAL_COMMUNITY): Payer: Self-pay | Admitting: General Surgery

## 2015-10-30 ENCOUNTER — Emergency Department (HOSPITAL_COMMUNITY)
Admission: EM | Admit: 2015-10-30 | Discharge: 2015-10-30 | Disposition: A | Discharge status: Home / Self Care | Payer: BLUE CROSS/BLUE SHIELD | Attending: Emergency Medicine | Admitting: Emergency Medicine

## 2015-10-30 ENCOUNTER — Encounter (HOSPITAL_COMMUNITY): Payer: Self-pay | Admitting: Emergency Medicine

## 2015-10-30 DIAGNOSIS — Z79899 Other long term (current) drug therapy: Secondary | ICD-10-CM | POA: Insufficient documentation

## 2015-10-30 DIAGNOSIS — K0889 Other specified disorders of teeth and supporting structures: Secondary | ICD-10-CM

## 2015-10-30 DIAGNOSIS — K029 Dental caries, unspecified: Secondary | ICD-10-CM | POA: Diagnosis not present

## 2015-10-30 DIAGNOSIS — Z8619 Personal history of other infectious and parasitic diseases: Secondary | ICD-10-CM | POA: Diagnosis not present

## 2015-10-30 DIAGNOSIS — F17210 Nicotine dependence, cigarettes, uncomplicated: Secondary | ICD-10-CM | POA: Diagnosis not present

## 2015-10-30 MED ORDER — MELOXICAM 7.5 MG PO TABS: 7.5000 mg | ORAL_TABLET | Freq: Every day | ORAL | Status: DC

## 2015-10-30 MED ORDER — AMOXICILLIN 500 MG PO CAPS: 500.0000 mg | ORAL_CAPSULE | Freq: Three times a day (TID) | ORAL | Status: AC

## 2015-10-30 NOTE — ED Provider Notes (Signed)
CSN: [id number]     Arrival date & time 10/30/15  1603 History   First MD Initiated Contact with Patient 10/30/15 1712     Chief Complaint  Patient presents with  . Dental Pain     (Consider location/radiation/quality/duration/timing/severity/associated sxs/prior Treatment) Patient is a 40 y.o. female presenting with tooth pain. The history is provided by the patient.  Dental Pain Associated symptoms: no facial swelling, no fever and no neck pain    Sally Bennett is a 40 y.o. female presenting with a 7 day history of dental pain and gingival swelling.   The patient has a history of injury and/or decay in the tooth involved which has recently started to cause increased  pain.  There has been no fevers, chills, nausea or vomiting, also no complaint of difficulty swallowing, although chewing makes pain worse.  The patient has tried aleve without relief of symptoms.      Past Medical History  Diagnosis Date  . Hepatitis C   . Hepatitis C virus   . GERD (gastroesophageal reflux disease)    Past Surgical History  Procedure Laterality Date  . Dilation and curettage of uterus    . Cholecystectomy N/A 04/28/2015    Procedure: LAPAROSCOPIC CHOLECYSTECTOMY;  Surgeon: Franky Macho, MD;  Location: [facility];  Service: General;  Laterality: N/A;  . Liver biopsy N/A 04/28/2015    Procedure: LIVER BIOPSY;  Surgeon: Franky Macho, MD;  Location: [facility];  Service: General;  Laterality: N/A;   No family history on file. Social History  Substance Use Topics  . Smoking status: Current Every Day Smoker -- 1.00 packs/day for 10 years    Types: Cigarettes  . Smokeless tobacco: None  . Alcohol Use: No   OB History    No data available     Review of Systems  Constitutional: Negative for fever.  HENT: Positive for dental problem. Negative for facial swelling and sore throat.   Respiratory: Negative for shortness of breath.   Musculoskeletal: Negative for neck pain and neck stiffness.       Allergies  Hydrocodone  Home Medications   Prior to Admission medications   Medication Sig Start Date End Date Taking? Authorizing Provider  escitalopram (LEXAPRO) 10 MG tablet Take 20 mg by mouth daily. 09/27/15  Yes Historical Provider, MD  amoxicillin (AMOXIL) 500 MG capsule Take 1 capsule (500 mg total) by mouth 3 (three) times daily. 10/30/15 11/09/15  Burgess Amor, PA-C  meloxicam (MOBIC) 7.5 MG tablet Take 1-2 tablets (7.5-15 mg total) by mouth daily. 10/30/15   Burgess Amor, PA-C  SUBOXONE 8-2 MG FILM Place 1 Film under the tongue 2 (two) times daily. Reported on 10/30/2015 10/24/15   Historical Provider, MD   BP 121/78 mmHg  Pulse 78  Temp(Src) 98.1 F (36.7 C) (Oral)  Resp 14  Ht  (1.626 m)  Wt 65.318 kg  BMI 24.71 kg/m2  SpO2 100%  LMP 10/16/2015 Physical Exam  Constitutional: She is oriented to person, place, and time. She appears well-developed and well-nourished. No distress.  HENT:  Head: Normocephalic and atraumatic.  Right Ear: Tympanic membrane and external ear normal.  Left Ear: Tympanic membrane and external ear normal.  Mouth/Throat: Oropharynx is clear and moist and mucous membranes are normal. No oral lesions. No trismus in the jaw. No dental abscesses.  Generalized poor dentition.  Multiple areas of decay.  Gingival edema (mild) left upper 2nd molar with no obvious decay at this tooth.  Eyes: Conjunctivae are normal.  Neck: Normal range of motion. Neck supple.  Cardiovascular: Normal rate and normal heart sounds.   Pulmonary/Chest: Effort normal.  Abdominal: She exhibits no distension.  Musculoskeletal: Normal range of motion.  Lymphadenopathy:    She has no cervical adenopathy.  Neurological: She is alert and oriented to person, place, and time.  Skin: Skin is warm and dry. No erythema.  Psychiatric: She has a normal mood and affect.    ED Course  Procedures (including critical care time) Labs Review Labs Reviewed - No data to  display  Imaging Review No results found. I have personally reviewed and evaluated these images and lab results as part of my medical decision-making.   EKG Interpretation None      MDM   Final diagnoses:  Pain, dental    Will cover for infection, amoxil, meloxicam,  Dental referrals given.    Burgess Amor, PA-C 10/30/15 1727  Bethann Berkshire, MD 10/30/15 [phone]

## 2015-10-30 NOTE — ED Notes (Signed)
Dental referral sheet given to pt. Patient given discharge instruction, verbalized understand. Patient ambulatory out of the department.

## 2015-10-30 NOTE — ED Notes (Signed)
Patient with c/o left upper dental pain x 7 days. Poor dentition. Unable to see dentist per patient

## 2015-10-30 NOTE — ED Notes (Signed)
Pt has not broken tooth, no redness, caries.

## 2015-10-30 NOTE — ED Notes (Signed)
PA to assess and discharge

## 2015-11-07 ENCOUNTER — Emergency Department (HOSPITAL_COMMUNITY)
Admission: EM | Admit: 2015-11-07 | Discharge: 2015-11-07 | Disposition: A | Discharge status: Home / Self Care | Payer: BLUE CROSS/BLUE SHIELD | Attending: Emergency Medicine | Admitting: Emergency Medicine

## 2015-11-07 ENCOUNTER — Emergency Department (HOSPITAL_COMMUNITY): Payer: BLUE CROSS/BLUE SHIELD

## 2015-11-07 ENCOUNTER — Encounter (HOSPITAL_COMMUNITY): Payer: Self-pay

## 2015-11-07 DIAGNOSIS — Y998 Other external cause status: Secondary | ICD-10-CM | POA: Insufficient documentation

## 2015-11-07 DIAGNOSIS — W228XXA Striking against or struck by other objects, initial encounter: Secondary | ICD-10-CM | POA: Diagnosis not present

## 2015-11-07 DIAGNOSIS — F17210 Nicotine dependence, cigarettes, uncomplicated: Secondary | ICD-10-CM | POA: Diagnosis not present

## 2015-11-07 DIAGNOSIS — Y9389 Activity, other specified: Secondary | ICD-10-CM | POA: Insufficient documentation

## 2015-11-07 DIAGNOSIS — Y92007 Garden or yard of unspecified non-institutional (private) residence as the place of occurrence of the external cause: Secondary | ICD-10-CM | POA: Insufficient documentation

## 2015-11-07 DIAGNOSIS — S8992XA Unspecified injury of left lower leg, initial encounter: Secondary | ICD-10-CM | POA: Diagnosis not present

## 2015-11-07 NOTE — ED Notes (Signed)
Unable to locate patient in room.

## 2015-11-07 NOTE — ED Notes (Signed)
Pt reports was working in the yard yesterday and 2 logs "collapsed" into her left knee.  Pt ambulatory.

## 2017-03-15 ENCOUNTER — Emergency Department (HOSPITAL_COMMUNITY): Payer: No Typology Code available for payment source

## 2017-03-15 ENCOUNTER — Encounter (HOSPITAL_COMMUNITY): Payer: Self-pay | Admitting: Emergency Medicine

## 2017-03-15 ENCOUNTER — Emergency Department (HOSPITAL_COMMUNITY)
Admission: EM | Admit: 2017-03-15 | Discharge: 2017-03-15 | Disposition: A | Discharge status: Home / Self Care | Payer: No Typology Code available for payment source | Attending: Emergency Medicine | Admitting: Emergency Medicine

## 2017-03-15 DIAGNOSIS — S8011XA Contusion of right lower leg, initial encounter: Secondary | ICD-10-CM | POA: Diagnosis not present

## 2017-03-15 DIAGNOSIS — F17210 Nicotine dependence, cigarettes, uncomplicated: Secondary | ICD-10-CM | POA: Insufficient documentation

## 2017-03-15 DIAGNOSIS — S40022A Contusion of left upper arm, initial encounter: Secondary | ICD-10-CM | POA: Diagnosis not present

## 2017-03-15 DIAGNOSIS — Y92410 Unspecified street and highway as the place of occurrence of the external cause: Secondary | ICD-10-CM | POA: Diagnosis not present

## 2017-03-15 DIAGNOSIS — S301XXA Contusion of abdominal wall, initial encounter: Secondary | ICD-10-CM | POA: Insufficient documentation

## 2017-03-15 DIAGNOSIS — Y999 Unspecified external cause status: Secondary | ICD-10-CM | POA: Insufficient documentation

## 2017-03-15 DIAGNOSIS — S8012XA Contusion of left lower leg, initial encounter: Secondary | ICD-10-CM | POA: Insufficient documentation

## 2017-03-15 DIAGNOSIS — Y939 Activity, unspecified: Secondary | ICD-10-CM | POA: Insufficient documentation

## 2017-03-15 DIAGNOSIS — S40021A Contusion of right upper arm, initial encounter: Secondary | ICD-10-CM | POA: Insufficient documentation

## 2017-03-15 DIAGNOSIS — R52 Pain, unspecified: Secondary | ICD-10-CM

## 2017-03-15 DIAGNOSIS — S199XXA Unspecified injury of neck, initial encounter: Secondary | ICD-10-CM | POA: Diagnosis present

## 2017-03-15 LAB — HCG, SERUM, QUALITATIVE: PREG SERUM: NEGATIVE

## 2017-03-15 NOTE — Progress Notes (Signed)
Arterial blood draw performed. [employer] x 2 & [place], [facility] security at bedside.
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
## 2017-03-15 NOTE — Discharge Instructions (Signed)
Ice packs to the injured or sore muscles for the next several days then start using heat. Take ibuprofen 600 mg + acetaminophen 1000 mg every 6 hrs as needed for pain and muscle spasms. Return to the ED for any problems listed on the head injury sheet. Recheck if you aren't improving in the next week.  YOU NEED TO STOP DOING DRUGS!!!! Look at the resource guide to get some help.
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
## 2017-07-16 ENCOUNTER — Emergency Department (HOSPITAL_COMMUNITY)
Admission: EM | Admit: 2017-07-16 | Discharge: 2017-07-16 | Disposition: A | Discharge status: Home / Self Care | Payer: Self-pay | Attending: Emergency Medicine | Admitting: Emergency Medicine

## 2017-07-16 ENCOUNTER — Encounter (HOSPITAL_COMMUNITY): Payer: Self-pay | Admitting: Emergency Medicine

## 2017-07-16 ENCOUNTER — Emergency Department (HOSPITAL_COMMUNITY): Payer: Self-pay

## 2017-07-16 DIAGNOSIS — Y9389 Activity, other specified: Secondary | ICD-10-CM | POA: Insufficient documentation

## 2017-07-16 DIAGNOSIS — Z79899 Other long term (current) drug therapy: Secondary | ICD-10-CM | POA: Insufficient documentation

## 2017-07-16 DIAGNOSIS — S93401A Sprain of unspecified ligament of right ankle, initial encounter: Secondary | ICD-10-CM | POA: Insufficient documentation

## 2017-07-16 DIAGNOSIS — F17210 Nicotine dependence, cigarettes, uncomplicated: Secondary | ICD-10-CM | POA: Insufficient documentation

## 2017-07-16 DIAGNOSIS — W01198A Fall on same level from slipping, tripping and stumbling with subsequent striking against other object, initial encounter: Secondary | ICD-10-CM | POA: Insufficient documentation

## 2017-07-16 DIAGNOSIS — Y929 Unspecified place or not applicable: Secondary | ICD-10-CM | POA: Insufficient documentation

## 2017-07-16 DIAGNOSIS — Y999 Unspecified external cause status: Secondary | ICD-10-CM | POA: Insufficient documentation

## 2017-07-16 MED ORDER — IBUPROFEN 800 MG PO TABS
800.0000 mg | ORAL_TABLET | Freq: Once | ORAL | Status: AC
  Administered 2017-07-16: 800 mg via ORAL
  Filled 2017-07-16: qty 1

## 2017-07-16 MED ORDER — IBUPROFEN 800 MG PO TABS: 800.0000 mg | ORAL_TABLET | Freq: Three times a day (TID) | ORAL | 0 refills | Status: DC

## 2017-07-16 MED ORDER — IBUPROFEN 100 MG/5ML PO SUSP: 800.0000 mg | Freq: Once | ORAL | Status: DC

## 2017-07-16 NOTE — ED Provider Notes (Signed)
[facility]   CSN: [id number] Arrival date & time: 07/16/17  1522     History   Chief Complaint Chief Complaint  Patient presents with  . Ankle Pain    HPI Sally Bennett is a 41 y.o. female with no significant past medical history presenting with sudden onset right ankle pain after stepping in a hole in her backyard unknowingly. Patient reports pain to the lateral aspect of her foot and dorsum. The pain is aggravated by weightbearing and movement especially inversion. No head trauma or loss of consciousness no other injuries.  HPI  Past Medical History:  Diagnosis Date  . GERD (gastroesophageal reflux disease)   . Hepatitis C   . Hepatitis C virus     There are no active problems to display for this patient.   Past Surgical History:  Procedure Laterality Date  . CHOLECYSTECTOMY N/A 04/28/2015   Procedure: LAPAROSCOPIC CHOLECYSTECTOMY;  Surgeon: Franky MachoMark Jenkins, MD;  Location: [facility];  Service: General;  Laterality: N/A;  . DILATION AND CURETTAGE OF UTERUS    . LIVER BIOPSY N/A 04/28/2015   Procedure: LIVER BIOPSY;  Surgeon: Franky MachoMark Jenkins, MD;  Location: [facility];  Service: General;  Laterality: N/A;    OB History    No data available       Home Medications    Prior to Admission medications   Medication Sig Start Date End Date Taking? Authorizing Provider  escitalopram (LEXAPRO) 10 MG tablet Take 20 mg by mouth daily. 09/27/15   [provider]  ibuprofen (ADVIL,MOTRIN) 800 MG tablet Take 1 tablet (800 mg total) by mouth 3 (three) times daily. 07/16/17   Georgiana ShoreMitchell, Schelly Chuba B, PA-C  meloxicam (MOBIC) 7.5 MG tablet Take 1-2 tablets (7.5-15 mg total) by mouth daily. Patient not taking: Reported on 11/07/2015 10/30/15   Burgess AmorIdol, Julie, PA-C  SUBOXONE 8-2 MG FILM Place 1 Film under the tongue 2 (two) times daily. Reported on 10/30/2015 10/24/15   [provider]    Family History History reviewed. No pertinent family  history.  Social History Social History  Substance Use Topics  . Smoking status: Current Every Day Smoker    Packs/day: 1.00    Years: 10.00    Types: Cigarettes  . Smokeless tobacco: Never Used  . Alcohol use No     Allergies   Hydrocodone   Review of Systems Review of Systems  Respiratory: Negative for cough, shortness of breath, wheezing and stridor.   Cardiovascular: Negative for chest pain, palpitations and leg swelling.  Gastrointestinal: Negative for nausea and vomiting.  Musculoskeletal: Positive for arthralgias and joint swelling. Negative for back pain, myalgias, neck pain and neck stiffness.  Skin: Negative for color change, pallor, rash and wound.  Neurological: Negative for seizures, syncope, weakness and numbness.     Physical Exam Updated Vital Signs BP (!) 126/91 (BP Location: Right Arm)   Pulse 100   Temp 98.5 F (36.9 C) (Oral)   Resp 18   Ht 5\' 4"  (1.626 m)   Wt 63.5 kg (140 lb)   LMP 07/07/2017   SpO2 99%   BMI 24.03 kg/m   Physical Exam  Constitutional: She appears well-developed and well-nourished. No distress.  Afebrile, nontoxic-appearing, sitting comfortably in chair in no acute distress.  HENT:  Head: Normocephalic and atraumatic.  Eyes: Conjunctivae and EOM are normal.  Neck: Normal range of motion. Neck supple.  Cardiovascular: Normal rate and intact distal pulses.   No murmur heard. Pulmonary/Chest: Effort normal.  No respiratory distress.  Musculoskeletal: Normal range of motion. She exhibits edema and tenderness. She exhibits no deformity.  Mild edema to the dorsum of the foot and tenderness palpation. Full range of motion. Pain with inversion. Negative anterior drawer test. Stable ankle joint. Tenderness palpation of the lateral or medial malleolus.  Neurological: She is alert. No sensory deficit.  Sensation intact and strong dorsalis pedis pulses. Neurovascularly intact bilaterally  Skin: Skin is warm and dry. Capillary refill  takes less than 2 seconds. No rash noted. She is not diaphoretic. No erythema. No pallor.  Psychiatric: She has a normal mood and affect.  Nursing note and vitals reviewed.    ED Treatments / Results  Labs (all labs ordered are listed, but only abnormal results are displayed) Labs Reviewed - No data to display  EKG  EKG Interpretation None       Radiology Dg Ankle Complete Right  Result Date: 07/16/2017 CLINICAL DATA:  Right ankle pain and minor swelling after fall and fall today. EXAM: RIGHT ANKLE - COMPLETE 3+ VIEW COMPARISON:  None. FINDINGS: There is no evidence of fracture, dislocation, or joint effusion. Ankle mortise appears intact. Base of fifth metatarsal appears intact. There is no evidence of arthropathy or other focal bone abnormality. Minimal soft tissue swelling about the malleoli. IMPRESSION: Negative for acute fracture or joint dislocations. Minimal soft tissue swelling about the malleoli. Electronically Signed   By: Tollie Eth M.D.   On: 07/16/2017 15:50    Procedures Procedures (including critical care time)  Medications Ordered in ED Medications  ibuprofen (ADVIL,MOTRIN) tablet 800 mg (800 mg Oral Given 07/16/17 1618)     Initial Impression / Assessment and Plan / ED Course  I have reviewed the triage vital signs and the nursing notes.  Pertinent labs & imaging results that were available during my care of the patient were reviewed by me and considered in my medical decision making (see chart for details).    Patient presenting with right ankle pain after unknowingly stepping into a hole in her backyard.  Negative plain films.  Reassuring exam, mild edema, no deformity, full range of motion. Neurovascularly intact.  Rice protocol indicated and discussed with patient. Pain managed while in the emergency department.  Will discharge home with symptomatic relief and close follow-up with PCP.  Discussed strict return precautions and advised to return  to the emergency department if experiencing any new or worsening symptoms. Instructions were understood and patient agreed with discharge plan.  Final Clinical Impressions(s) / ED Diagnoses   Final diagnoses:  Sprain of right ankle, unspecified ligament, initial encounter    New Prescriptions Discharge Medication List as of 07/16/2017  4:12 PM    START taking these medications   Details  ibuprofen (ADVIL,MOTRIN) 800 MG tablet Take 1 tablet (800 mg total) by mouth 3 (three) times daily., Starting Wed 07/16/2017, Print         Georgiana Shore, PA-C 07/16/17 1624    Georgiana Shore, PA-C 07/16/17 1650    Tilden Fossa, MD 07/19/17 [phone]

## 2017-07-16 NOTE — ED Triage Notes (Signed)
Pt reports stepped in a hole today and reports right ankle pain ever since. Moderate swelling noted to RLE. nad noted.

## 2017-07-16 NOTE — Discharge Instructions (Signed)
As discussed, please follow the rice protocol provided in these instructions. Take ibuprofen as needed for pain and swelling. Follow-up with her primary care provider if symptoms persist beyond a week.  Return sooner if you experience loss of sensation, cool cold extremity or other concerning symptoms in the meantime.

## 2021-10-02 ENCOUNTER — Other Ambulatory Visit: Payer: Self-pay

## 2021-10-02 ENCOUNTER — Ambulatory Visit
Admission: EM | Admit: 2021-10-02 | Discharge: 2021-10-02 | Disposition: A | Discharge status: Home / Self Care | Payer: Self-pay | Attending: Student | Admitting: Student

## 2021-10-02 DIAGNOSIS — J4521 Mild intermittent asthma with (acute) exacerbation: Secondary | ICD-10-CM

## 2021-10-02 MED ORDER — AMOXICILLIN 875 MG PO TABS: 875.0000 mg | ORAL_TABLET | Freq: Two times a day (BID) | ORAL | 0 refills | Status: AC

## 2021-10-02 MED ORDER — PREDNISONE 20 MG PO TABS: 40.0000 mg | ORAL_TABLET | Freq: Every day | ORAL | 0 refills | Status: AC

## 2021-10-02 MED ORDER — ALBUTEROL SULFATE HFA 108 (90 BASE) MCG/ACT IN AERS
2.0000 | INHALATION_SPRAY | Freq: Once | RESPIRATORY_TRACT | Status: AC
  Administered 2021-10-02: 2 via RESPIRATORY_TRACT

## 2021-10-02 NOTE — ED Triage Notes (Signed)
Pt presents to the office for cough,congestion and fatigue x 2-3 days.

## 2021-10-02 NOTE — ED Provider Notes (Signed)
[facility]    CSN: [id number] Arrival date & time: 10/02/21  1032      History   Chief Complaint Chief Complaint  Patient presents with   Cough    CONGESTION AND WHEEZING X 1 WEEK.    HPI Sally Bennett is a 46 y.o. female presenting with cough, congestion for 7 days.  Medical history GERD, hepatitis C. History asthma per pt. Describes nonproductive cough, without SOB, CP, dizziness, weakness, fevers/chills. States some wheezing. Denies n/v/d/c. Inhaler that she has at home is providing relief.  HPI  Past Medical History:  Diagnosis Date   GERD (gastroesophageal reflux disease)    Hepatitis C    Hepatitis C virus     There are no problems to display for this patient.   Past Surgical History:  Procedure Laterality Date   CHOLECYSTECTOMY N/A 04/28/2015   Procedure: LAPAROSCOPIC CHOLECYSTECTOMY;  Surgeon: Aviva Signs, MD;  Location: [facility];  Service: General;  Laterality: N/A;   DILATION AND CURETTAGE OF UTERUS     LIVER BIOPSY N/A 04/28/2015   Procedure: LIVER BIOPSY;  Surgeon: Aviva Signs, MD;  Location: [facility];  Service: General;  Laterality: N/A;    OB History   No obstetric history on file.      Home Medications    Prior to Admission medications   Medication Sig Start Date End Date Taking? Authorizing Provider  amoxicillin (AMOXIL) 875 MG tablet Take 1 tablet (875 mg total) by mouth 2 (two) times daily for 7 days. 10/02/21 10/09/21 Yes Hazel Sams, PA-C  predniSONE (DELTASONE) 20 MG tablet Take 2 tablets (40 mg total) by mouth daily for 5 days. Take with breakfast or lunch. Avoid NSAIDs (ibuprofen, etc) while taking this medication. 10/02/21 10/07/21 Yes Hazel Sams, PA-C  escitalopram (LEXAPRO) 10 MG tablet Take 20 mg by mouth daily. 09/27/15   [provider]  ibuprofen (ADVIL,MOTRIN) 800 MG tablet Take 1 tablet (800 mg total) by mouth 3 (three) times daily. 07/16/17   Emeline General, PA-C  meloxicam (MOBIC) 7.5 MG tablet Take  1-2 tablets (7.5-15 mg total) by mouth daily. Patient not taking: Reported on 11/07/2015 10/30/15   Evalee Jefferson, PA-C  SUBOXONE 8-2 MG FILM Place 1 Film under the tongue 2 (two) times daily. Reported on 10/30/2015 10/24/15   [provider]    Family History History reviewed. No pertinent family history.  Social History Social History   Tobacco Use   Smoking status: Every Day    Packs/day: 1.00    Years: 10.00    Pack years: 10.00    Types: Cigarettes   Smokeless tobacco: Never  Substance Use Topics   Alcohol use: No   Drug use: No     Allergies   Hydrocodone   Review of Systems Review of Systems  Constitutional:  Negative for appetite change, chills and fever.  HENT:  Positive for congestion. Negative for ear pain, rhinorrhea, sinus pressure, sinus pain and sore throat.   Eyes:  Negative for redness and visual disturbance.  Respiratory:  Positive for cough. Negative for chest tightness, shortness of breath and wheezing.   Cardiovascular:  Negative for chest pain and palpitations.  Gastrointestinal:  Negative for abdominal pain, constipation, diarrhea, nausea and vomiting.  Genitourinary:  Negative for dysuria, frequency and urgency.  Musculoskeletal:  Negative for myalgias.  Neurological:  Negative for dizziness, weakness and headaches.  Psychiatric/Behavioral:  Negative for confusion.   All other systems reviewed and are negative.   Physical  Exam Triage Vital Signs ED Triage Vitals  Enc Vitals Group     BP 10/02/21 1058 118/75     Pulse Rate 10/02/21 1058 85     Resp 10/02/21 1058 16     Temp 10/02/21 1058 98.3 F (36.8 C)     Temp Source 10/02/21 1058 Oral     SpO2 --      Weight --      Height --      Head Circumference --      Peak Flow --      Pain Score 10/02/21 1059 6     Pain Loc --      Pain Edu? --      Excl. in [place]? --    No data found.  Updated Vital Signs BP 118/75 (BP Location: Left Arm)    Pulse 85    Temp 98.3 F (36.8 C) (Oral)     Resp 16    LMP 08/20/2021    SpO2 94%   Visual Acuity Right Eye Distance:   Left Eye Distance:   Bilateral Distance:    Right Eye Near:   Left Eye Near:    Bilateral Near:     Physical Exam Vitals reviewed.  Constitutional:      General: She is not in acute distress.    Appearance: Normal appearance. She is not ill-appearing.  HENT:     Head: Normocephalic and atraumatic.     Right Ear: Tympanic membrane, ear canal and external ear normal. No tenderness. No middle ear effusion. There is no impacted cerumen. Tympanic membrane is not perforated, erythematous, retracted or bulging.     Left Ear: Tympanic membrane, ear canal and external ear normal. No tenderness.  No middle ear effusion. There is no impacted cerumen. Tympanic membrane is not perforated, erythematous, retracted or bulging.     Nose: Nose normal. No congestion.     Mouth/Throat:     Mouth: Mucous membranes are moist.     Pharynx: Uvula midline. No oropharyngeal exudate or posterior oropharyngeal erythema.  Eyes:     Extraocular Movements: Extraocular movements intact.     Pupils: Pupils are equal, round, and reactive to light.  Cardiovascular:     Rate and Rhythm: Normal rate and regular rhythm.     Heart sounds: Normal heart sounds.  Pulmonary:     Effort: Pulmonary effort is normal.     Breath sounds: Wheezing present. No decreased breath sounds, rhonchi or rales.     Comments: Wheezes throughout, improved following albuterol inhaler  Abdominal:     Palpations: Abdomen is soft.     Tenderness: There is no abdominal tenderness. There is no guarding or rebound.  Lymphadenopathy:     Cervical: No cervical adenopathy.     Right cervical: No superficial cervical adenopathy.    Left cervical: No superficial cervical adenopathy.  Neurological:     General: No focal deficit present.     Mental Status: She is alert and oriented to person, place, and time.  Psychiatric:        Mood and Affect: Mood normal.         Behavior: Behavior normal.        Thought Content: Thought content normal.        Judgment: Judgment normal.     UC Treatments / Results  Labs (all labs ordered are listed, but only abnormal results are displayed) Labs Reviewed - No data to display  EKG   Radiology No results  found.  Procedures Procedures (including critical care time)  Medications Ordered in UC Medications  albuterol (VENTOLIN HFA) 108 (90 Base) MCG/ACT inhaler 2 puff (has no administration in time range)    Initial Impression / Assessment and Plan / UC Course  I have reviewed the triage vital signs and the nursing notes.  Pertinent labs & imaging results that were available during my care of the patient were reviewed by me and considered in my medical decision making (see chart for details).     This patient is a very pleasant 46 y.o. year old female presenting with asthma exacerbation related to virus x7 days. Afebrile, nontachy. Albuterol inhaler provided during visit with improvement in wheezing. LMP 08/20/2021, States she is not pregnant or breastfeeding.  Declines covid and influenza testing.   Low-dose prednisone as below. Continue albuterol.   I do have concern for pneumonia given exam today and duration of symptoms. Patient prefers to defer CXR given cost. I am amenable to sending amoxicillin to cover for this.   ED return precautions discussed. Patient verbalizes understanding and agreement.   Coding Level 4 for acute exacerbation of chronic illness, and prescription drug management   Final Clinical Impressions(s) / UC Diagnoses   Final diagnoses:  Mild intermittent asthma with (acute) exacerbation     Discharge Instructions      -Albuterol inhaler as needed for cough, wheezing, shortness of breath, 1 to 2 puffs every 6 hours as needed. -Amoxicillin twice daily x7 days -Prednisone, 2 pills taken at the same time for 5 days in a row.  Try taking this earlier in the day as it can give  you energy. Avoid NSAIDs like ibuprofen and alleve while taking this medication as they can increase your risk of stomach upset and even GI bleeding when in combination with a steroid. You can continue tylenol (acetaminophen) up to 1000mg  3x daily. -Follow-up if symptoms getting worse instead of better     ED Prescriptions     Medication Sig Dispense Auth. Provider   predniSONE (DELTASONE) 20 MG tablet Take 2 tablets (40 mg total) by mouth daily for 5 days. Take with breakfast or lunch. Avoid NSAIDs (ibuprofen, etc) while taking this medication. 10 tablet Hazel Sams, PA-C   amoxicillin (AMOXIL) 875 MG tablet Take 1 tablet (875 mg total) by mouth 2 (two) times daily for 7 days. 14 tablet Hazel Sams, PA-C      PDMP not reviewed this encounter.   Hazel Sams, PA-C 10/02/21 1129

## 2021-10-02 NOTE — Discharge Instructions (Addendum)
-  Albuterol inhaler as needed for cough, wheezing, shortness of breath, 1 to 2 puffs every 6 hours as needed. -Amoxicillin twice daily x7 days -Prednisone, 2 pills taken at the same time for 5 days in a row.  Try taking this earlier in the day as it can give you energy. Avoid NSAIDs like ibuprofen and alleve while taking this medication as they can increase your risk of stomach upset and even GI bleeding when in combination with a steroid. You can continue tylenol (acetaminophen) up to 1000mg  3x daily. -Follow-up if symptoms getting worse instead of better

## 2022-07-03 LAB — HM MAMMOGRAPHY: HM Mammogram: NORMAL (ref 0–4)

## 2023-03-19 DIAGNOSIS — H5213 Myopia, bilateral: Secondary | ICD-10-CM | POA: Diagnosis not present

## 2023-04-10 ENCOUNTER — Encounter: Payer: Self-pay | Admitting: Family Medicine

## 2023-04-10 ENCOUNTER — Ambulatory Visit (INDEPENDENT_AMBULATORY_CARE_PROVIDER_SITE_OTHER): Payer: Medicaid Other | Admitting: Family Medicine

## 2023-04-10 VITALS — BP 114/74 | HR 103 | Ht 65.0 in | Wt 186.0 lb

## 2023-04-10 DIAGNOSIS — Z13220 Encounter for screening for lipoid disorders: Secondary | ICD-10-CM

## 2023-04-10 DIAGNOSIS — Z131 Encounter for screening for diabetes mellitus: Secondary | ICD-10-CM

## 2023-04-10 DIAGNOSIS — Z8709 Personal history of other diseases of the respiratory system: Secondary | ICD-10-CM

## 2023-04-10 DIAGNOSIS — J45909 Unspecified asthma, uncomplicated: Secondary | ICD-10-CM | POA: Insufficient documentation

## 2023-04-10 DIAGNOSIS — E559 Vitamin D deficiency, unspecified: Secondary | ICD-10-CM

## 2023-04-10 DIAGNOSIS — R600 Localized edema: Secondary | ICD-10-CM

## 2023-04-10 DIAGNOSIS — Z0001 Encounter for general adult medical examination with abnormal findings: Secondary | ICD-10-CM | POA: Diagnosis not present

## 2023-04-10 DIAGNOSIS — Z1211 Encounter for screening for malignant neoplasm of colon: Secondary | ICD-10-CM

## 2023-04-10 DIAGNOSIS — R0602 Shortness of breath: Secondary | ICD-10-CM | POA: Diagnosis not present

## 2023-04-10 DIAGNOSIS — Z1231 Encounter for screening mammogram for malignant neoplasm of breast: Secondary | ICD-10-CM

## 2023-04-10 DIAGNOSIS — Z1329 Encounter for screening for other suspected endocrine disorder: Secondary | ICD-10-CM

## 2023-04-10 DIAGNOSIS — Z1159 Encounter for screening for other viral diseases: Secondary | ICD-10-CM

## 2023-04-10 MED ORDER — BLOOD PRESSURE KIT DEVI: 0 refills | Status: AC

## 2023-04-10 MED ORDER — BUDESONIDE-FORMOTEROL FUMARATE 80-4.5 MCG/ACT IN AERO: 2.0000 | INHALATION_SPRAY | Freq: Two times a day (BID) | RESPIRATORY_TRACT | 3 refills | Status: DC

## 2023-04-10 MED ORDER — CELEXA 20 MG PO TABS: 20.0000 mg | ORAL_TABLET | Freq: Every day | ORAL | 3 refills | Status: AC

## 2023-04-10 MED ORDER — FUROSEMIDE 20 MG PO TABS: 20.0000 mg | ORAL_TABLET | Freq: Once | ORAL | 3 refills | Status: AC | PRN

## 2023-04-10 NOTE — Assessment & Plan Note (Signed)
Started maintenance program Symbicort inhaler 2 puffs twice daily, Explained to patient it's important to use maintenance inhaler even when you have no symptoms. Albuterol inhaler PRN use only. Encouraged non pharmacological interventions such as avoiding allergens, having SABA inhaler handy at all times. Asthma action plan reviewed and updated. Discussed signs and symptoms of respiratory distress and need to present to the ED.Patient/parent verbalizes understanding regarding plan of care and all questions answered.

## 2023-04-10 NOTE — Progress Notes (Signed)
   New Patient Office Visit   Subjective   Patient ID: Sally Bennett, female    DOB: 09-12-1976  Age: 47 y.o. MRN: [id number]  CC: No chief complaint on file.   HPI Sally Bennett 47 year old female, presents to establish care. She  has a past medical history of GERD (gastroesophageal reflux disease), Hepatitis C, and Hepatitis C virus.  HPI    Outpatient Encounter Medications as of 04/10/2023  Medication Sig   escitalopram (LEXAPRO) 10 MG tablet Take 20 mg by mouth daily.   ibuprofen (ADVIL,MOTRIN) 800 MG tablet Take 1 tablet (800 mg total) by mouth 3 (three) times daily.   meloxicam (MOBIC) 7.5 MG tablet Take 1-2 tablets (7.5-15 mg total) by mouth daily. (Patient not taking: Reported on 11/07/2015)   SUBOXONE 8-2 MG FILM Place 1 Film under the tongue 2 (two) times daily. Reported on 10/30/2015   No facility-administered encounter medications on file as of 04/10/2023.    Past Surgical History:  Procedure Laterality Date   CHOLECYSTECTOMY N/A 04/28/2015   Procedure: LAPAROSCOPIC CHOLECYSTECTOMY;  Surgeon: Franky Macho, MD;  Location: [facility];  Service: General;  Laterality: N/A;   DILATION AND CURETTAGE OF UTERUS     LIVER BIOPSY N/A 04/28/2015   Procedure: LIVER BIOPSY;  Surgeon: Franky Macho, MD;  Location: [facility];  Service: General;  Laterality: N/A;    ROS    Objective    There were no vitals taken for this visit.  Physical Exam    Assessment & Plan:  There are no diagnoses linked to this encounter.  Return in about 1 month (around 05/11/2023) for Pap smear.   Cruzita Lederer Newman Nip, FNP

## 2023-04-10 NOTE — Assessment & Plan Note (Signed)
Physical exam done, labs ordered  Updated screening and health maintenance  Exercise and nutrition counseling BMI  30.95 Discussed to start lifestyle modifications follow diet low in saturated fat, reduce dietary salt intake, avoid fatty foods, maintain an exercise routine 3 to 5 days a week for a minimum total of 150 minutes.

## 2023-04-10 NOTE — Progress Notes (Signed)
Complete physical exam  Patient: Sally Bennett   DOB: 07-Jun-1976   47 y.o. Female  MRN: [id number]  Subjective:    Chief Complaint  Patient presents with   Establish Care    Patient is here to establish care. Wants to discuss HTN meds. Was on losartan 50mg  and norvasc 10mg . Has not been on it for a week.     Sally Bennett is a 47 y.o. female who presents today for a complete physical exam. She reports consuming a general diet.  Patient reports walking one mile a day.  She generally feels okay. She reports sleeping well. She does not have additional problems to discuss today.    Most recent fall risk assessment:    04/10/2023    2:27 PM  Fall Risk   Falls in the past year? 0  Number falls in past yr: 0  Injury with Fall? 0  Risk for fall due to : No Fall Risks  Follow up Falls evaluation completed     Most recent depression screenings:    04/10/2023    2:27 PM  PHQ 2/9 Scores  PHQ - 2 Score 2  PHQ- 9 Score 4    Vision:Within last year and Dental: No current dental problems and Receives regular dental care  Patient Care Team: Del Newman Nip, Tenna Child, FNP as PCP - General (Family Medicine)   Outpatient Medications Prior to Visit  Medication Sig   amLODipine (NORVASC) 10 MG tablet Take 10 mg by mouth daily.   losartan (COZAAR) 50 MG tablet Take 50 mg by mouth daily.   SUBOXONE 8-2 MG FILM Place 1 Film under the tongue 2 (two) times daily. Reported on 10/30/2015   [DISCONTINUED] CELEXA 20 MG tablet Take by mouth.   [DISCONTINUED] escitalopram (LEXAPRO) 10 MG tablet Take 20 mg by mouth daily.   [DISCONTINUED] ibuprofen (ADVIL,MOTRIN) 800 MG tablet Take 1 tablet (800 mg total) by mouth 3 (three) times daily.   [DISCONTINUED] meloxicam (MOBIC) 7.5 MG tablet Take 1-2 tablets (7.5-15 mg total) by mouth daily. (Patient not taking: Reported on 11/07/2015)   No facility-administered medications prior to visit.    Review of Systems  Constitutional:  Negative for chills and  fever.  Eyes:  Negative for blurred vision.  Respiratory:  Negative for shortness of breath.   Cardiovascular:  Positive for leg swelling. Negative for chest pain.  Gastrointestinal:  Negative for abdominal pain.  Genitourinary:  Negative for dysuria.  Skin:  Negative for rash.  Neurological:  Negative for dizziness and headaches.       Objective:    BP 114/74   Pulse (!) 103   Ht 5\' 5"  (1.651 m)   Wt 186 lb (84.4 kg)   SpO2 95%   BMI 30.95 kg/m  BP Readings from Last 3 Encounters:  04/10/23 114/74  10/02/21 118/75  07/16/17 (!) 126/91      Physical Exam Vitals reviewed.  Constitutional:      General: She is not in acute distress.    Appearance: Normal appearance. She is not ill-appearing, toxic-appearing or diaphoretic.  HENT:     Head: Normocephalic.     Right Ear: Tympanic membrane normal.     Left Ear: Tympanic membrane normal.     Nose: Nose normal.     Mouth/Throat:     Mouth: Mucous membranes are moist.  Eyes:     General:        Right eye: No discharge.  Left eye: No discharge.     Conjunctiva/sclera: Conjunctivae normal.     Pupils: Pupils are equal, round, and reactive to light.  Cardiovascular:     Rate and Rhythm: Normal rate.     Pulses: Normal pulses.     Heart sounds: Normal heart sounds.  Pulmonary:     Effort: Pulmonary effort is normal. No respiratory distress.     Breath sounds: Normal breath sounds.  Abdominal:     General: Bowel sounds are normal.     Palpations: Abdomen is soft.     Tenderness: There is no abdominal tenderness. There is no right CVA tenderness, left CVA tenderness or guarding.  Musculoskeletal:        General: Normal range of motion.     Cervical back: Normal range of motion.     Right lower leg: Edema present.     Left lower leg: Edema present.  Skin:    General: Skin is warm and dry.     Capillary Refill: Capillary refill takes less than 2 seconds.  Neurological:     General: No focal deficit present.      Mental Status: She is alert and oriented to person, place, and time.     Coordination: Coordination normal.     Gait: Gait normal.  Psychiatric:        Mood and Affect: Mood normal.        Behavior: Behavior normal.      Results for orders placed or performed in visit on 04/10/23  HM MAMMOGRAPHY  Result Value Ref Range   HM Mammogram Self Reported Normal 0-4 Bi-Rad, Self Reported Normal      Assessment & Plan:    Routine Health Maintenance and Physical Exam  Immunization History  Administered Date(s) Administered   Tdap 02/18/2023    Health Maintenance  Topic Date Due   Hepatitis C Screening  Never done   Colonoscopy  Never done   COVID-19 Vaccine (1 - 2023-24 season) 05/26/2023 (Originally 05/17/2022)   INFLUENZA VACCINE  04/17/2023   PAP SMEAR-Modifier  01/21/2025   DTaP/Tdap/Td (2 - Td or Tdap) 02/17/2033   HIV Screening  Completed   HPV VACCINES  Aged Out    Discussed health benefits of physical activity, and encouraged her to engage in regular exercise appropriate for her age and condition.  Screening for diabetes mellitus -     Microalbumin / creatinine urine ratio -     Hemoglobin A1c  Screening for lipid disorders -     Lipid panel -     CBC with Differential/Platelet -     BMP8+eGFR  Screening for thyroid disorder -     TSH + free T4  Need for hepatitis C screening test -     Hepatitis C antibody  Vitamin D deficiency -     VITAMIN D 25 Hydroxy (Vit-D Deficiency, Fractures)  Encounter for screening mammogram for malignant neoplasm of breast  Screening for colon cancer -     Cologuard  History of asthma Assessment & Plan: Started maintenance program Symbicort inhaler 2 puffs twice daily, Explained to patient it's important to use maintenance inhaler even when you have no symptoms. Albuterol inhaler PRN use only. Encouraged non pharmacological interventions such as avoiding allergens, having SABA inhaler handy at all times. Asthma action plan  reviewed and updated. Discussed signs and symptoms of respiratory distress and need to present to the ED.Patient/parent verbalizes understanding regarding plan of care and all questions answered.   Orders: -  Budesonide-Formoterol Fumarate; Inhale 2 puffs into the lungs 2 (two) times daily.  Dispense: 1 each; Refill: 3  SOB (shortness of breath) -     Brain natriuretic peptide  Bilateral leg edema Assessment & Plan: Lasix 20 mg PRN Advise patient follow a low-salt diet, wear support stockings, maintain an exercise routine 3-5 times a week, keep legs on elevated position raise legs on pillow above your heart while lying down.    Encounter for routine adult physical exam with abnormal findings Assessment & Plan: Physical exam done, labs ordered  Updated screening and health maintenance  Exercise and nutrition counseling BMI  30.95 Discussed to start lifestyle modifications follow diet low in saturated fat, reduce dietary salt intake, avoid fatty foods, maintain an exercise routine 3 to 5 days a week for a minimum total of 150 minutes.    Other orders -     Blood Pressure Kit; Blood pressure monitoring twice a day  Dispense: 1 each; Refill: 0 -     CeleXA; Take 1 tablet (20 mg total) by mouth daily.  Dispense: 30 tablet; Refill: 3 -     Furosemide; Take 1 tablet (20 mg total) by mouth once as needed for up to 1 dose.  Dispense: 30 tablet; Refill: 3    Return in about 2 weeks (around 04/24/2023), or if symptoms worsen or fail to improve, for re-check blood pressure.     Cruzita Lederer Newman Nip, FNP

## 2023-04-10 NOTE — Patient Instructions (Signed)

## 2023-04-10 NOTE — Assessment & Plan Note (Signed)
Lasix 20 mg PRN Advise patient follow a low-salt diet, wear support stockings, maintain an exercise routine 3-5 times a week, keep legs on elevated position raise legs on pillow above your heart while lying down.

## 2023-04-29 DIAGNOSIS — Z0001 Encounter for general adult medical examination with abnormal findings: Secondary | ICD-10-CM | POA: Diagnosis not present

## 2023-04-29 DIAGNOSIS — E559 Vitamin D deficiency, unspecified: Secondary | ICD-10-CM | POA: Diagnosis not present

## 2023-04-29 DIAGNOSIS — Z131 Encounter for screening for diabetes mellitus: Secondary | ICD-10-CM | POA: Diagnosis not present

## 2023-04-29 DIAGNOSIS — Z13220 Encounter for screening for lipoid disorders: Secondary | ICD-10-CM | POA: Diagnosis not present

## 2023-04-29 DIAGNOSIS — Z8709 Personal history of other diseases of the respiratory system: Secondary | ICD-10-CM | POA: Diagnosis not present

## 2023-04-29 DIAGNOSIS — Z1159 Encounter for screening for other viral diseases: Secondary | ICD-10-CM | POA: Diagnosis not present

## 2023-04-29 DIAGNOSIS — R600 Localized edema: Secondary | ICD-10-CM | POA: Diagnosis not present

## 2023-04-29 DIAGNOSIS — Z1329 Encounter for screening for other suspected endocrine disorder: Secondary | ICD-10-CM | POA: Diagnosis not present

## 2023-04-30 ENCOUNTER — Ambulatory Visit: Payer: Medicaid Other | Admitting: Family Medicine

## 2023-04-30 ENCOUNTER — Other Ambulatory Visit: Payer: Self-pay | Admitting: Family Medicine

## 2023-04-30 DIAGNOSIS — R768 Other specified abnormal immunological findings in serum: Secondary | ICD-10-CM

## 2023-05-04 DIAGNOSIS — Z1211 Encounter for screening for malignant neoplasm of colon: Secondary | ICD-10-CM | POA: Diagnosis not present

## 2023-05-09 ENCOUNTER — Ambulatory Visit: Payer: Medicaid Other | Admitting: Family Medicine

## 2023-05-15 ENCOUNTER — Ambulatory Visit: Payer: Medicaid Other | Admitting: Family Medicine

## 2023-05-29 ENCOUNTER — Other Ambulatory Visit: Payer: Self-pay | Admitting: Family Medicine

## 2023-05-29 DIAGNOSIS — Z8709 Personal history of other diseases of the respiratory system: Secondary | ICD-10-CM

## 2023-07-23 DIAGNOSIS — J101 Influenza due to other identified influenza virus with other respiratory manifestations: Secondary | ICD-10-CM | POA: Diagnosis not present

## 2023-07-23 DIAGNOSIS — E669 Obesity, unspecified: Secondary | ICD-10-CM | POA: Diagnosis not present

## 2023-07-23 DIAGNOSIS — U071 COVID-19: Secondary | ICD-10-CM | POA: Diagnosis not present

## 2023-07-23 DIAGNOSIS — J209 Acute bronchitis, unspecified: Secondary | ICD-10-CM | POA: Diagnosis not present

## 2023-07-23 DIAGNOSIS — I10 Essential (primary) hypertension: Secondary | ICD-10-CM | POA: Diagnosis not present

## 2023-07-23 DIAGNOSIS — Z6830 Body mass index (BMI) 30.0-30.9, adult: Secondary | ICD-10-CM | POA: Diagnosis not present

## 2023-07-23 DIAGNOSIS — J22 Unspecified acute lower respiratory infection: Secondary | ICD-10-CM | POA: Diagnosis not present

## 2023-07-23 DIAGNOSIS — J9801 Acute bronchospasm: Secondary | ICD-10-CM | POA: Diagnosis not present

## 2023-08-19 ENCOUNTER — Other Ambulatory Visit: Payer: Self-pay | Admitting: Family Medicine

## 2023-08-19 DIAGNOSIS — Z8709 Personal history of other diseases of the respiratory system: Secondary | ICD-10-CM

## 2023-09-26 ENCOUNTER — Ambulatory Visit: Payer: Self-pay | Admitting: Family Medicine

## 2023-09-26 NOTE — Telephone Encounter (Signed)
  Chief Complaint: high BP reading x1 Symptoms: denies Frequency: unknown Pertinent Negatives: Patient denies HA, denies vision changes, denies CP, denies dizziness  Disposition: [] ED /[] Urgent Care (no appt availability in office) / [] Appointment(In office/virtual)/ []  [place] [facility]/ [x] Home Care/ [] Refused Recommended Disposition /[] [facility]/ []  Follow-up with PCP  Additional Notes: Pt went to suboxone clinic today and was told her BP was 136/100. Pt states that she had a stressful and busy morning.  Pt states that she had HTN while shea was incarcerated d/t the high sodium diet. States that since she was released the pts BP has normalized and HTN medications were discontinued per her PCP. Pt states that she does not currently have a way to check her BP.  Pt will take 2 readings 10+ more minutes apart upon returning home today after work, sometime this evening. Pt advised on BP parameters per the care advice. Pt understands parameters and will call back or go to the ED depending on the readings. Pt advised to keep a BP journal AM and PM until appt on 1/17.   Copied from CRM [id number]. Topic: Clinical - Pink Word Triage >> Sep 26, 2023 10:07 AM Sally Bennett wrote: Reason for CRM: Sally Bennett would like a nurse to call her today about her blood pressure. Her blood pressure has been going up and down. Today it was 138/100. I made her an appointment for 1st available on 10/03/23. She will take her blood pressure everyday. She wants to talk about any signs she needs to be watching for. She is a little nervous and she has no medication for high blood pressure. It is on her list but no refills. She wants to discuss before refilling med. Please call her at [phone]. Thanks Reason for Disposition  [1] Systolic BP  >= 130 OR Diastolic >= 80 AND [2] not taking BP medications  Answer Assessment - Initial Assessment Questions 1. BLOOD PRESSURE: What is the blood pressure? Did you take at  least two measurements 5 minutes apart?     BP was only taken once today, does not have the ability to take the BP until this evening.  2. ONSET: When did you take your blood pressure?     Pt had BP taken today but was after she was physically active and stressful event.  3. HOW: How did you take your blood pressure? (e.g., automatic home BP monitor, visiting nurse)     [facility]omatic at [facility] 4. HISTORY: Do you have a history of high blood pressure?     States recent hx d/t high sodium diet in prison 5. MEDICINES: Are you taking any medicines for blood pressure? Have you missed any doses recently?     Unsure of med she took at the time of her incarceration, currently not on any meds 6. OTHER SYMPTOMS: Do you have any symptoms? (e.g., blurred vision, chest pain, difficulty breathing, headache, weakness)     denies 7. PREGNANCY: Is there any chance you are pregnant? When was your last menstrual period?     denies  Protocols used: Blood Pressure - High-A-AH

## 2023-09-29 ENCOUNTER — Other Ambulatory Visit: Payer: Self-pay | Admitting: Family Medicine

## 2023-09-29 DIAGNOSIS — Z8709 Personal history of other diseases of the respiratory system: Secondary | ICD-10-CM

## 2023-10-03 ENCOUNTER — Encounter: Payer: Self-pay | Admitting: Internal Medicine

## 2023-10-03 ENCOUNTER — Ambulatory Visit (INDEPENDENT_AMBULATORY_CARE_PROVIDER_SITE_OTHER): Payer: Medicaid Other | Admitting: Internal Medicine

## 2023-10-03 VITALS — BP 132/80 | HR 115 | Ht 65.0 in | Wt 178.6 lb

## 2023-10-03 DIAGNOSIS — J4530 Mild persistent asthma, uncomplicated: Secondary | ICD-10-CM

## 2023-10-03 DIAGNOSIS — Z8679 Personal history of other diseases of the circulatory system: Secondary | ICD-10-CM | POA: Diagnosis not present

## 2023-10-03 DIAGNOSIS — F3341 Major depressive disorder, recurrent, in partial remission: Secondary | ICD-10-CM | POA: Diagnosis not present

## 2023-10-03 DIAGNOSIS — Z720 Tobacco use: Secondary | ICD-10-CM | POA: Insufficient documentation

## 2023-10-03 MED ORDER — VENTOLIN HFA 108 (90 BASE) MCG/ACT IN AERS: 2.0000 | INHALATION_SPRAY | Freq: Four times a day (QID) | RESPIRATORY_TRACT | 1 refills | Status: DC | PRN

## 2023-10-03 MED ORDER — BUDESONIDE-FORMOTEROL FUMARATE 160-4.5 MCG/ACT IN AERO: 2.0000 | INHALATION_SPRAY | Freq: Two times a day (BID) | RESPIRATORY_TRACT | 5 refills | Status: AC

## 2023-10-03 NOTE — Assessment & Plan Note (Signed)
Well controlled with Celexa, managed by Suboxone clinic H/o substance abuse, in remission now

## 2023-10-03 NOTE — Assessment & Plan Note (Signed)
Overall well controlled with Symbicort Added albuterol as needed for dyspnea or wheezing as rescue inhaler

## 2023-10-03 NOTE — Patient Instructions (Signed)
Continue using Symbicort regularly.  Please use Albuterol as needed for shortness of breath or wheezing.  Please follow low salt diet and perform moderate exercise/walking at least 150 mins/week.

## 2023-10-03 NOTE — Progress Notes (Signed)
Established Patient Office Visit  Subjective:  Patient ID: Sally Bennett, female    DOB: [birth year]  Age: 48 y.o. MRN: [id number]  CC:  Chief Complaint  Patient presents with   Care Management    Hypertension concerns . Pt has readings from all this week. Readings from going to see suboxene clinic have been elevated she has those readings with her as well.     HPI Sally Bennett is a 48 y.o. female with past medical history of HTN and MDD who presents for f/u of her chronic medical conditions.  HTN: Her BP is well controlled today.  She went to Suboxone clinic recently, and had BP of 150/100.  She used to take amlodipine 10 mg QD and losartan 50 mg QD when she was in jail.  She attributed it to high salt diet in the jail.  Her blood pressure has been overall WNL at home as well, she has brought home BP readings.  She denies headache, dizziness, chest pain, dyspnea or palpitations.  Asthma: She uses Symbicort twice daily, but does not have a rescue inhaler currently.  Denies dyspnea or wheezing currently.    Past Medical History:  Diagnosis Date   Anxiety    Depression    GERD (gastroesophageal reflux disease)    Hepatitis C    Hepatitis C virus    Hypertension    Substance abuse (HCC)     Past Surgical History:  Procedure Laterality Date   CHOLECYSTECTOMY N/A 04/28/2015   Procedure: LAPAROSCOPIC CHOLECYSTECTOMY;  Surgeon: Franky Macho, MD;  Location: [facility];  Service: General;  Laterality: N/A;   DILATION AND CURETTAGE OF UTERUS     LIVER BIOPSY N/A 04/28/2015   Procedure: LIVER BIOPSY;  Surgeon: Franky Macho, MD;  Location: [facility];  Service: General;  Laterality: N/A;    Family History  Problem Relation Age of Onset   Hyperlipidemia Father     Social History   Socioeconomic History   Marital status: Single    Spouse name: Not on file   Number of children: Not on file   Years of education: Not on file   Highest education level: 12th grade  Occupational History    Not on file  Tobacco Use   Smoking status: Every Day    Current packs/day: 0.50    Average packs/day: 0.5 packs/day for 23.0 years (11.5 ttl pk-yrs)    Types: Cigarettes    Start date: 2002   Smokeless tobacco: Never  Substance and Sexual Activity   Alcohol use: No   Drug use: No   Sexual activity: Not on file  Other Topics Concern   Not on file  Social History Narrative   Not on file   Social Drivers of Health   Financial Resource Strain: Low Risk  (10/02/2023)   Overall Financial Resource Strain (CARDIA)    Difficulty of Paying Living Expenses: Not hard at all  Food Insecurity: No Food Insecurity (10/02/2023)   Hunger Vital Sign    Worried About Running Out of Food in the Last Year: Never true    Ran Out of Food in the Last Year: Never true  Transportation Needs: No Transportation Needs (10/02/2023)   PRAPARE - Administrator, Civil Service (Medical): No    Lack of Transportation (Non-Medical): No  Physical Activity: Insufficiently Active (10/02/2023)   Exercise Vital Sign    Days of Exercise per Week: 3 days    Minutes of Exercise per Session: 20 min  Stress: Stress Concern Present (10/02/2023)   [employer] of Occupational Health - Occupational Stress Questionnaire    Feeling of Stress : To some extent  Social Connections: Moderately Isolated (10/02/2023)   Social Connection and Isolation Panel [NHANES]    Frequency of Communication with Friends and Family: More than three times a week    Frequency of Social Gatherings with Friends and Family: Once a week    Attends Religious Services: More than 4 times per year    Active Member of [employer] or Organizations: No    Attends Engineer, structural: Not on file    Marital Status: Divorced  Catering manager Violence: Not on file    Outpatient Medications Prior to Visit  Medication Sig Dispense Refill   Blood Pressure Monitoring (BLOOD PRESSURE KIT) DEVI Blood pressure monitoring twice a day 1 each 0    CELEXA 20 MG tablet Take 1 tablet (20 mg total) by mouth daily. 30 tablet 3   furosemide (LASIX) 20 MG tablet Take 1 tablet (20 mg total) by mouth once as needed for up to 1 dose. 30 tablet 3   SUBOXONE 8-2 MG FILM Place 1 Film under the tongue 2 (two) times daily. Reported on 10/30/2015  0   SYMBICORT 160-4.5 MCG/ACT inhaler INHALE 2 PUFFS INTO THE LUNGS TWICE DAILY 10.2 g 0   amLODipine (NORVASC) 10 MG tablet Take 10 mg by mouth daily.     losartan (COZAAR) 50 MG tablet Take 50 mg by mouth daily.     No facility-administered medications prior to visit.    Allergies  Allergen Reactions   Hydrocodone Hives    ROS Review of Systems  Constitutional:  Negative for chills and fever.  HENT:  Negative for congestion, sinus pressure, sinus pain and sore throat.   Eyes:  Negative for pain and discharge.  Respiratory:  Negative for cough and shortness of breath.   Cardiovascular:  Negative for chest pain and palpitations.  Gastrointestinal:  Negative for abdominal pain, diarrhea, nausea and vomiting.  Endocrine: Negative for polydipsia and polyuria.  Genitourinary:  Negative for dysuria and hematuria.  Musculoskeletal:  Negative for neck pain and neck stiffness.  Skin:  Negative for rash.  Neurological:  Negative for dizziness and weakness.  Psychiatric/Behavioral:  Negative for agitation and behavioral problems.       Objective:    Physical Exam Vitals reviewed.  Constitutional:      General: She is not in acute distress.    Appearance: She is not diaphoretic.  HENT:     Head: Normocephalic and atraumatic.     Nose: Nose normal. No congestion.     Mouth/Throat:     Mouth: Mucous membranes are moist.     Pharynx: No posterior oropharyngeal erythema.  Eyes:     General: No scleral icterus.    Extraocular Movements: Extraocular movements intact.  Cardiovascular:     Rate and Rhythm: Normal rate and regular rhythm.     Heart sounds: No murmur heard. Pulmonary:     Breath  sounds: Normal breath sounds. No wheezing or rales.  Musculoskeletal:     Cervical back: Neck supple. No tenderness.     Right lower leg: No edema.     Left lower leg: No edema.  Skin:    General: Skin is warm.     Findings: No rash.  Neurological:     General: No focal deficit present.     Mental Status: She is alert and oriented to person, place,  and time.  Psychiatric:        Mood and Affect: Mood normal.        Behavior: Behavior normal.     BP 132/80 (BP Location: Left Arm)   Pulse (!) 115   Ht 5\' 5"  (1.651 m)   Wt 178 lb 9.6 oz (81 kg)   SpO2 97%   BMI 29.72 kg/m  Wt Readings from Last 3 Encounters:  10/03/23 178 lb 9.6 oz (81 kg)  04/10/23 186 lb (84.4 kg)  07/16/17 140 lb (63.5 kg)    Lab Results  Component Value Date   TSH 1.700 04/29/2023   Lab Results  Component Value Date   WBC 11.3 (H) 04/29/2023   HGB 12.8 04/29/2023   HCT 41.2 04/29/2023   MCV 80 04/29/2023   PLT 239 04/29/2023   Lab Results  Component Value Date   NA 140 04/29/2023   K 4.4 04/29/2023   CO2 18 (L) 04/29/2023   GLUCOSE 83 04/29/2023   BUN 10 04/29/2023   CREATININE 0.75 04/29/2023   BILITOT 0.4 04/26/2015   ALKPHOS 116 04/26/2015   AST 41 04/26/2015   ALT 41 04/26/2015   PROT 7.3 04/26/2015   ALBUMIN 4.0 04/26/2015   CALCIUM 9.1 04/29/2023   ANIONGAP 7 04/26/2015   EGFR 100 04/29/2023   Lab Results  Component Value Date   CHOL 158 04/29/2023   Lab Results  Component Value Date   HDL 52 04/29/2023   Lab Results  Component Value Date   LDLCALC 89 04/29/2023   Lab Results  Component Value Date   TRIG 93 04/29/2023   Lab Results  Component Value Date   CHOLHDL 3.0 04/29/2023   Lab Results  Component Value Date   HGBA1C 5.7 (H) 04/29/2023      Assessment & Plan:   Problem List Items Addressed This Visit       Respiratory   Asthma - Primary   Overall well controlled with Symbicort Added albuterol as needed for dyspnea or wheezing as rescue  inhaler       Relevant Medications   budesonide-formoterol (SYMBICORT) 160-4.5 MCG/ACT inhaler   albuterol (VENTOLIN HFA) 108 (90 Base) MCG/ACT inhaler     Other   History of hypertension   BP Readings from Last 1 Encounters:  10/03/23 132/80   Well-controlled with diet modification Would avoid antihypertensive currently Advised DASH diet and moderate exercise/walking, at least 150 mins/week       Recurrent major depressive disorder, in partial remission (HCC)   Well controlled with Celexa, managed by Suboxone clinic H/o substance abuse, in remission now      Tobacco use   Smokes about 0.5 pack/day  Asked about quitting: confirms that he/she currently smokes cigarettes Advise to quit smoking: Educated about QUITTING to reduce the risk of cancer, cardio and cerebrovascular disease. Assess willingness: Unwilling to quit at this time, but is working on cutting back. Assist with counseling and pharmacotherapy: Counseled for 2-3 minutes. Arrange for follow up: follow up in 3 months and continue to offer help.       Meds ordered this encounter  Medications   budesonide-formoterol (SYMBICORT) 160-4.5 MCG/ACT inhaler    Sig: Inhale 2 puffs into the lungs 2 (two) times daily.    Dispense:  10.2 g    Refill:  5   albuterol (VENTOLIN HFA) 108 (90 Base) MCG/ACT inhaler    Sig: Inhale 2 puffs into the lungs every 6 (six) hours as needed for wheezing or shortness  of breath.    Dispense:  18 g    Refill:  1    Follow-up: Return in about 6 months (around 04/01/2024).    Anabel Halon, MD

## 2023-10-03 NOTE — Assessment & Plan Note (Signed)
BP Readings from Last 1 Encounters:  10/03/23 132/80   Well-controlled with diet modification Would avoid antihypertensive currently Advised DASH diet and moderate exercise/walking, at least 150 mins/week

## 2023-10-03 NOTE — Assessment & Plan Note (Addendum)
Smokes about 0.5 pack/day  Asked about quitting: confirms that he/she currently smokes cigarettes Advise to quit smoking: Educated about QUITTING to reduce the risk of cancer, cardio and cerebrovascular disease. Assess willingness: Unwilling to quit at this time, but is working on cutting back. Assist with counseling and pharmacotherapy: Counseled for 2-3 minutes. Arrange for follow up: follow up in 3 months and continue to offer help.

## 2023-12-19 DIAGNOSIS — M7712 Lateral epicondylitis, left elbow: Secondary | ICD-10-CM | POA: Diagnosis not present

## 2024-04-02 ENCOUNTER — Ambulatory Visit: Payer: Medicaid Other | Admitting: Family Medicine

## 2024-07-19 ENCOUNTER — Other Ambulatory Visit: Payer: Self-pay | Admitting: Internal Medicine

## 2024-07-19 DIAGNOSIS — J4530 Mild persistent asthma, uncomplicated: Secondary | ICD-10-CM

## 2024-08-11 ENCOUNTER — Other Ambulatory Visit: Payer: Self-pay | Admitting: Internal Medicine

## 2024-08-11 DIAGNOSIS — J4530 Mild persistent asthma, uncomplicated: Secondary | ICD-10-CM
